# Patient Record
Sex: Male | Born: 1935 | Race: Black or African American | Hispanic: No | Marital: Married | Smoking: Former smoker
Health system: Southern US, Community
[De-identification: ages and names within clinical notes are randomized; demographics above are authoritative.]

---

## 1998-05-14 ENCOUNTER — Encounter: Payer: Self-pay | Admitting: Emergency Medicine

## 1998-05-14 ENCOUNTER — Emergency Department (HOSPITAL_COMMUNITY): Admission: EM | Admit: 1998-05-14 | Discharge: 1998-05-14 | Payer: Self-pay | Admitting: Emergency Medicine

## 1999-08-12 ENCOUNTER — Emergency Department (HOSPITAL_COMMUNITY): Admission: EM | Admit: 1999-08-12 | Discharge: 1999-08-12 | Payer: Self-pay | Admitting: Emergency Medicine

## 2001-08-02 ENCOUNTER — Emergency Department (HOSPITAL_COMMUNITY): Admission: EM | Admit: 2001-08-02 | Discharge: 2001-08-02 | Payer: Self-pay | Admitting: Emergency Medicine

## 2003-09-30 ENCOUNTER — Emergency Department (HOSPITAL_COMMUNITY): Admission: EM | Admit: 2003-09-30 | Discharge: 2003-09-30 | Payer: Self-pay

## 2004-11-11 ENCOUNTER — Ambulatory Visit (HOSPITAL_COMMUNITY): Admission: RE | Admit: 2004-11-11 | Discharge: 2004-11-11 | Payer: Self-pay | Admitting: Gastroenterology

## 2006-09-21 ENCOUNTER — Emergency Department (HOSPITAL_COMMUNITY): Admission: EM | Admit: 2006-09-21 | Discharge: 2006-09-21 | Payer: Self-pay | Admitting: Family Medicine

## 2007-12-04 ENCOUNTER — Emergency Department (HOSPITAL_COMMUNITY): Admission: EM | Admit: 2007-12-04 | Discharge: 2007-12-04 | Payer: Self-pay | Admitting: Emergency Medicine

## 2008-02-25 ENCOUNTER — Emergency Department (HOSPITAL_COMMUNITY): Admission: EM | Admit: 2008-02-25 | Discharge: 2008-02-25 | Payer: Self-pay | Admitting: Emergency Medicine

## 2010-11-29 NOTE — Op Note (Signed)
NAME:  [name], [name] NO.:  [id number]   MEDICAL RECORD NO.:  [id number]          PATIENT TYPE:  AMB   LOCATION:  ENDO                         FACILITY:  [facility]   PHYSICIAN:  Danise Edge, M.D.   DATE OF BIRTH:  21-Jul-1935   DATE OF PROCEDURE:  11/11/2004  DATE OF DISCHARGE:                                 OPERATIVE REPORT   INDICATIONS:  [name] is a 76 year old male born 15-Mar-1936.  [name] is scheduled to undergo his first screening colonoscopy with  polypectomy to prevent colon cancer. He denies a personal or family history  of colorectal neoplasia.   ENDOSCOPIST:  Danise Edge, M.D.   PREMEDICATION:  Versed 2.5 milligrams, Demerol 50 milligrams.   PROCEDURE:  After obtaining informed consent, [name] was placed in the  left lateral decubitus position. I administered intravenous Demerol and  intravenous Versed to achieve conscious sedation for the procedure. The  patient's blood pressure, oxygen saturation and cardiac rhythm were  monitored throughout the procedure and documented in the medical record.   Anal inspection was normal. Digital rectal exam reveals an enlarged but  nonnodular prostate. The Olympus adjustable pediatric colonoscope was  introduced into the rectum and advanced to the cecum.  A normal-appearing  ileocecal valve and appendiceal orifice were identified. Colonic preparation  for the exam today was excellent.   Rectum normal.  Retroflexed view of the distal rectum normal.  Sigmoid colon and descending colon normal.  Splenic flexure normal.  Transverse colon normal.  Hepatic flexure normal.  Ascending colon normal.  Cecum and ileocecal valve normal.   ASSESSMENT:  Normal screening proctocolonoscopy to the cecum.     MJ/MEDQ  D:  11/11/2004  T:  11/11/2004  Job:  [id number]   cc:   Georgann Housekeeper, MD  [address]  [place]  [place] [address]  Fax: [phone]

## 2011-04-11 LAB — POCT I-STAT, CHEM 8
BUN: 24 — ABNORMAL HIGH
Calcium, Ion: 1.12
Chloride: 103
Creatinine, Ser: 1.4
Glucose, Bld: 115 — ABNORMAL HIGH
HCT: 46
Hemoglobin: 15.6
Potassium: 4.7
Sodium: 138
TCO2: 29

## 2011-04-11 LAB — POCT CARDIAC MARKERS
CKMB, poc: 2
Myoglobin, poc: 85
Troponin i, poc: <0.05

## 2016-04-03 ENCOUNTER — Emergency Department (HOSPITAL_COMMUNITY): Payer: BLUE CROSS/BLUE SHIELD

## 2016-04-03 ENCOUNTER — Encounter (HOSPITAL_COMMUNITY): Payer: Self-pay

## 2016-04-03 ENCOUNTER — Emergency Department (HOSPITAL_COMMUNITY)
Admission: EM | Admit: 2016-04-03 | Discharge: 2016-04-03 | Discharge status: Against Medical Advice | Payer: BLUE CROSS/BLUE SHIELD | Attending: Emergency Medicine | Admitting: Emergency Medicine

## 2016-04-03 DIAGNOSIS — R61 Generalized hyperhidrosis: Secondary | ICD-10-CM | POA: Insufficient documentation

## 2016-04-03 DIAGNOSIS — R55 Syncope and collapse: Secondary | ICD-10-CM | POA: Insufficient documentation

## 2016-04-03 DIAGNOSIS — Z87891 Personal history of nicotine dependence: Secondary | ICD-10-CM | POA: Diagnosis not present

## 2016-04-03 DIAGNOSIS — R9431 Abnormal electrocardiogram [ECG] [EKG]: Secondary | ICD-10-CM | POA: Diagnosis not present

## 2016-04-03 DIAGNOSIS — R42 Dizziness and giddiness: Secondary | ICD-10-CM | POA: Diagnosis present

## 2016-04-03 LAB — CBC WITH DIFFERENTIAL/PLATELET
BASOS PCT: 1 %
Basophils Absolute: 0.1 10*3/uL (ref 0.0–0.1)
EOS ABS: 0.5 10*3/uL (ref 0.0–0.7)
Eosinophils Relative: 8 %
HCT: 40.4 % (ref 39.0–52.0)
HEMOGLOBIN: 13.2 g/dL (ref 13.0–17.0)
Lymphocytes Relative: 37 %
Lymphs Abs: 2.4 10*3/uL (ref 0.7–4.0)
MCH: 30.8 pg (ref 26.0–34.0)
MCHC: 32.7 g/dL (ref 30.0–36.0)
MCV: 94.4 fL (ref 78.0–100.0)
MONOS PCT: 9 %
Monocytes Absolute: 0.6 10*3/uL (ref 0.1–1.0)
NEUTROS PCT: 45 %
Neutro Abs: 2.9 10*3/uL (ref 1.7–7.7)
Platelets: 243 10*3/uL (ref 150–400)
RBC: 4.28 MIL/uL (ref 4.22–5.81)
RDW: 13.7 % (ref 11.5–15.5)
WBC: 6.4 10*3/uL (ref 4.0–10.5)

## 2016-04-03 LAB — BASIC METABOLIC PANEL
Anion gap: 4 — ABNORMAL LOW (ref 5–15)
BUN: 19 mg/dL (ref 6–20)
CALCIUM: 8.8 mg/dL — AB (ref 8.9–10.3)
CHLORIDE: 106 mmol/L (ref 101–111)
CO2: 29 mmol/L (ref 22–32)
CREATININE: 1.36 mg/dL — AB (ref 0.61–1.24)
GFR, EST AFRICAN AMERICAN: 56 mL/min — AB (ref >60)
GFR, EST NON AFRICAN AMERICAN: 49 mL/min — AB (ref >60)
Glucose, Bld: 100 mg/dL — ABNORMAL HIGH (ref 65–99)
Potassium: 4.2 mmol/L (ref 3.5–5.1)
SODIUM: 139 mmol/L (ref 135–145)

## 2016-04-03 LAB — CBG MONITORING, ED: Glucose-Capillary: 81 mg/dL (ref 65–99)

## 2016-04-03 LAB — TROPONIN I

## 2016-04-03 NOTE — ED Triage Notes (Signed)
Per EMS- Patient was at work and became diaphoretic and having a near syncopal episode. Patient's initial BP-190/70.  Patient denies any medical problems.

## 2016-04-03 NOTE — ED Provider Notes (Signed)
[facility]-EMERGENCY DEPT Provider Note   CSN: [id number] Arrival date & time: 04/03/16  4098     History   Chief Complaint Chief Complaint  Patient presents with  . Near Syncope    sweating    HPI Charles Porter is a 80 y.o. male.  HPI  Pt was seen at 0745. Per pt, c/o sudden onset and resolution of one episode of near syncope that occurred PTA. Pt states he was standing and suddenly began to "sweat" and "feel lightheaded." Pt states he sat down and his symptoms resolved over 5 to 10 minutes. Denies any other symptoms. Symptoms remain resolved upon arrival to the ED. Denies CP/palpitations, no SOB/cough, no abd pain, no N/V/D, no focal motor weakness, no tingling/numbness in extremities, no vertigo.   History reviewed. No pertinent past medical history.  There are no active problems to display for this patient.   History reviewed. No pertinent surgical history.    Home Medications    Prior to Admission medications   Not on File    Family History History reviewed. No pertinent family history.  Social History Social History  Substance Use Topics  . Smoking status: Former Smoker    Types: Cigarettes  . Smokeless tobacco: Never Used  . Alcohol use No     Allergies   Review of patient's allergies indicates not on file.   Review of Systems Review of Systems ROS: Statement: All systems negative except as marked or noted in the HPI; Constitutional: Negative for fever and chills. +diaphoresis, near syncope.; ; Eyes: Negative for eye pain, redness and discharge. ; ; ENMT: Negative for ear pain, hoarseness, nasal congestion, sinus pressure and sore throat. ; ; Cardiovascular: Negative for chest pain, palpitations, dyspnea and peripheral edema. ; ; Respiratory: Negative for cough, wheezing and stridor. ; ; Gastrointestinal: Negative for nausea, vomiting, diarrhea, abdominal pain, blood in stool, hematemesis, jaundice and rectal bleeding. . ; ; Genitourinary: Negative for  dysuria, flank pain and hematuria. ; ; Musculoskeletal: Negative for back pain and neck pain. Negative for swelling and trauma.; ; Skin: Negative for pruritus, rash, abrasions, blisters, bruising and skin lesion.; ; Neuro: Negative for headache and neck stiffness. Negative for altered level of consciousness, altered mental status, extremity weakness, paresthesias, involuntary movement, seizure and syncope.       Physical Exam Updated Vital Signs BP 181/62 (BP Location: Left Arm)   Pulse 60   Temp 97.6 F (36.4 C) (Oral)   Resp 16   SpO2 98%     08:24 Orthostatic Vital Signs AR  Orthostatic Lying   BP- Lying: 170/56  Pulse- Lying: 54      Orthostatic Sitting  BP- Sitting: 171/68  Pulse- Sitting: 58      Orthostatic Standing at 0 minutes  BP- Standing at 0 minutes: 188/66  Pulse- Standing at 0 minutes: 55     Physical Exam 0750: Physical examination:  Nursing notes reviewed; Vital signs and O2 SAT reviewed;  Constitutional: Well developed, Well nourished, Well hydrated, In no acute distress; Head:  Normocephalic, atraumatic; Eyes: EOMI, PERRL, No scleral icterus; ENMT: Mouth and pharynx normal, Mucous membranes moist; Neck: Supple, Full range of motion, No lymphadenopathy; Cardiovascular: Regular rate and rhythm, No gallop; Respiratory: Breath sounds clear & equal bilaterally, No wheezes.  Speaking full sentences with ease, Normal respiratory effort/excursion; Chest: Nontender, Movement normal; Abdomen: Soft, Nontender, Nondistended, Normal bowel sounds; Genitourinary: No CVA tenderness; Extremities: Pulses normal, No tenderness, No edema, No calf edema or asymmetry.; Neuro: AA&Ox3, Major  CN grossly intact. No facial droop. Speech clear. Grips equal. Strength 5/5 equal bilat UE's and LE's. No gross focal motor or sensory deficits in extremities.; Skin: Color normal, Warm, Dry.   ED Treatments / Results  Labs (all labs ordered are listed, but only abnormal results are  displayed)   EKG  EKG Interpretation  Date/Time:  Thursday April 03 2016 07:38:35 EDT Ventricular Rate:  51 PR Interval:    QRS Duration: 78 QT Interval:  434 QTC Calculation: 400 R Axis:   -5 Text Interpretation:  Sinus rhythm Atrial premature complex Anterior infarct, old Nonspecific T abnormalities, lateral leads Minimal ST elevation, inferior leads When compared with ECG of 02/25/2008 ST & T wave abnormality is now Present Confirmed by [facility]  MD, Nicholos JohnsKATHLEEN [phone]([id number]) on 04/03/2016 8:12:32 AM        EKG Interpretation  Date/Time:  Thursday April 03 2016 07:45:52 EDT Ventricular Rate:  60 PR Interval:    QRS Duration: 77 QT Interval:  421 QTC Calculation: 421 R Axis:   5 Text Interpretation:  Sinus rhythm Anterior infarct, old Nonspecific T abnormalities, lateral leads Minimal ST elevation, inferior leads No significant change since last tracing earler today Confirmed by [facility]  MD, Nicholos JohnsKATHLEEN [phone]([id number]) on 04/03/2016 8:13:32 AM         Radiology   Procedures Procedures (including critical care time)  Medications Ordered in ED Medications - No data to display   Initial Impression / Assessment and Plan / ED Course  I have reviewed the triage vital signs and the nursing notes.  Pertinent labs & imaging results that were available during my care of the patient were reviewed by me and considered in my medical decision making (see chart for details).  MDM Reviewed: previous chart, nursing note and vitals Reviewed previous: labs and ECG Interpretation: labs, ECG and x-ray   Results for orders placed or performed during the hospital encounter of 04/03/16  CBC with Differential  Result Value Ref Range   WBC 6.4 4.0 - 10.5 K/uL   RBC 4.28 4.22 - 5.81 MIL/uL   Hemoglobin 13.2 13.0 - 17.0 g/dL   HCT 41.340.4 24.439.0 - 01.052.0 %   MCV 94.4 78.0 - 100.0 fL   MCH 30.8 26.0 - 34.0 pg   MCHC 32.7 30.0 - 36.0 g/dL   RDW 27.213.7 53.611.5 - 64.415.5 %   Platelets 243 150 - 400 K/uL    Neutrophils Relative % 45 %   Neutro Abs 2.9 1.7 - 7.7 K/uL   Lymphocytes Relative 37 %   Lymphs Abs 2.4 0.7 - 4.0 K/uL   Monocytes Relative 9 %   Monocytes Absolute 0.6 0.1 - 1.0 K/uL   Eosinophils Relative 8 %   Eosinophils Absolute 0.5 0.0 - 0.7 K/uL   Basophils Relative 1 %   Basophils Absolute 0.1 0.0 - 0.1 K/uL  Basic metabolic panel  Result Value Ref Range   Sodium 139 135 - 145 mmol/L   Potassium 4.2 3.5 - 5.1 mmol/L   Chloride 106 101 - 111 mmol/L   CO2 29 22 - 32 mmol/L   Glucose, Bld 100 (H) 65 - 99 mg/dL   BUN 19 6 - 20 mg/dL   Creatinine, Ser 0.341.36 (H) 0.61 - 1.24 mg/dL   Calcium 8.8 (L) 8.9 - 10.3 mg/dL   GFR calc non Af Amer 49 (L) >60 mL/min   GFR calc Af Amer 56 (L) >60 mL/min   Anion gap 4 (L) 5 - 15  Troponin I  Result  Value Ref Range   Troponin I <0.03 <0.03 ng/mL  CBG monitoring, ED  Result Value Ref Range   Glucose-Capillary 81 65 - 99 mg/dL   Dg Chest 2 View Result Date: 04/03/2016 CLINICAL DATA:  Patient with dizziness.  Blurry vision. EXAM: CHEST  2 VIEW COMPARISON:  Chest radiograph 09/30/2003. FINDINGS: Multiple monitoring leads overlie the patient. Normal cardiac and mediastinal contours. No consolidative pulmonary opacities. No pleural effusion or pneumothorax. Regional skeleton is unremarkable. IMPRESSION: No active cardiopulmonary disease. Electronically Signed   By: Annia Belt M.D.   On: 04/03/2016 08:14    0750:  EKG plus repeat obtained.  T/C to [facility] STEMI Dr. Clifton James, case discussed, including:  HPI, pertinent PM/SHx, VS/PE, dx testing, ED course and treatment:  He has viewed the EKG's, agrees they are abnl but not acute STEMI; pt needs admit with Cards consult.  1610: T/C to Unassigned Cards Dr. Jacinto Halim, case discussed, including:  HPI, pertinent PM/SHx, VS/PE, dx testing, ED course and treatment:  Agreeable to consult, admit to medicine service.  9604:  Pt now refuses to stay for observation admission. Pt informed re: dx testing results,  including new EKG changes concerning for cardiac insufficiency and that I recommend observation admission for further evaluation.  Pt refuses admission.  I encouraged pt to stay, continues to refuse.  Pt makes his own medical decisions.  Risks of AMA explained to pt, including, but not limited to:  stroke, heart attack, cardiac arrythmia ("irregular heart rate/beat"), "passing out," temporary and/or permanent disability, death.  Pt verb understanding and continues to refuse admission, understanding the consequences of his decision.  I encouraged pt to follow up with his PMD tomorrow and return to the ED immediately if symptoms return, or for any other concerns.  Pt verb understanding, agreeable.     Final Clinical Impressions(s) / ED Diagnoses   Final diagnoses:  None    New Prescriptions New Prescriptions   No medications on file     Samuel Jester, DO 04/05/16 1241

## 2016-04-03 NOTE — ED Notes (Signed)
Bed: WA04 Expected date:  Expected time:  Means of arrival:  Comments: EMS- Diaphoresis/HTN/Light headed

## 2016-04-03 NOTE — Discharge Instructions (Signed)
Your EKG today was abnormal.  Call your regular medical doctor and the Cardiologist today to schedule a follow up appointment within the next 2 days.  Return to the Emergency Department immediately sooner if worsening or you change your mind about being admitted.

## 2017-09-22 ENCOUNTER — Encounter (HOSPITAL_COMMUNITY): Payer: Self-pay | Admitting: Emergency Medicine

## 2017-09-22 ENCOUNTER — Emergency Department (HOSPITAL_COMMUNITY)
Admission: EM | Admit: 2017-09-22 | Discharge: 2017-09-23 | Disposition: A | Discharge status: Home / Self Care | Payer: BLUE CROSS/BLUE SHIELD | Attending: Emergency Medicine | Admitting: Emergency Medicine

## 2017-09-22 ENCOUNTER — Other Ambulatory Visit: Payer: Self-pay

## 2017-09-22 DIAGNOSIS — J111 Influenza due to unidentified influenza virus with other respiratory manifestations: Secondary | ICD-10-CM | POA: Insufficient documentation

## 2017-09-22 DIAGNOSIS — Z87891 Personal history of nicotine dependence: Secondary | ICD-10-CM | POA: Diagnosis not present

## 2017-09-22 DIAGNOSIS — R69 Illness, unspecified: Secondary | ICD-10-CM

## 2017-09-22 DIAGNOSIS — R05 Cough: Secondary | ICD-10-CM | POA: Diagnosis present

## 2017-09-22 NOTE — ED Notes (Signed)
Called patient 3x's in waiting room to call his daughter and he didn't answer.

## 2017-09-22 NOTE — ED Notes (Signed)
Please ask patient to call his daughter, Jedediah MoccasinChantelle @ [phone]

## 2017-09-22 NOTE — ED Triage Notes (Signed)
Pt reports generalized weakness including generalized body aches and joint soreness for the past week.  Denies n/v/d, fever "broke" yesterday.  Reports increased "flem" but did not cough.

## 2017-09-23 ENCOUNTER — Emergency Department (HOSPITAL_COMMUNITY): Payer: BLUE CROSS/BLUE SHIELD

## 2017-09-23 DIAGNOSIS — J111 Influenza due to unidentified influenza virus with other respiratory manifestations: Secondary | ICD-10-CM | POA: Diagnosis not present

## 2017-09-23 NOTE — ED Notes (Signed)
Pt did not answer in waiting room to go back to B16. Pt moved  Back to waiting room.

## 2017-09-23 NOTE — ED Notes (Signed)
NA x3

## 2017-09-23 NOTE — ED Notes (Signed)
Louraine called this RN and said pt showed up to front and sts he has been in waiting room the whole time.

## 2017-09-23 NOTE — ED Provider Notes (Signed)
[facility] EMERGENCY DEPARTMENT Provider Note   CSN: [id number] Arrival date & time: 09/22/17  2044     History   Chief Complaint Chief Complaint  Patient presents with  . flu like symptoms    HPI Charles Porter is a 82 y.o. male.  The history is provided by the patient.  He comes in with a 4-day history of generalized malaise.  There has been nasal congestion and a cough productive of yellow sputum.  He denies dyspnea and denies fever or chills.  He has had occasional sweats.  He denies nausea or vomiting.  He denies arthralgias or myalgias.  He has treated himself with a variety of over-the-counter cough and cold medications without any benefit.  He is concerned he might have influenza.  He did not receive the influenza vaccination.  He denies any sick contacts.  He is a non-smoker.  History reviewed. No pertinent past medical history.  There are no active problems to display for this patient.   History reviewed. No pertinent surgical history.     Home Medications    Prior to Admission medications   Not on File    Family History No family history on file.  Social History Social History   Tobacco Use  . Smoking status: Former Smoker    Types: Cigarettes  . Smokeless tobacco: Never Used  Substance Use Topics  . Alcohol use: No  . Drug use: No     Allergies   Patient has no known allergies.   Review of Systems Review of Systems  All other systems reviewed and are negative.    Physical Exam Updated Vital Signs BP (!) 157/58 (BP Location: Right Arm)   Pulse (!) 56   Temp 98.7 F (37.1 C) (Oral)   Resp 16   Ht 5\' 6"  (1.676 m)   Wt 65.8 kg (145 lb)   SpO2 99%   BMI 23.40 kg/m   Physical Exam  Nursing note and vitals reviewed.  82 year old male, resting comfortably and in no acute distress. Vital signs are significant for elevated systolic blood pressure. Oxygen saturation is 99%, which is normal. Head is normocephalic and  atraumatic. PERRLA, EOMI. Oropharynx is clear.  There is no sinus tenderness. Neck is nontender and supple without adenopathy or JVD. Back is nontender and there is no CVA tenderness. Lungs have faint rales at the left base without wheezes or rhonchi. Chest is nontender. Heart has regular rate and rhythm without murmur. Abdomen is soft, flat, nontender without masses or hepatosplenomegaly and peristalsis is normoactive. Extremities have no cyanosis or edema, full range of motion is present. Skin is warm and dry without rash. Neurologic: Mental status is normal, cranial nerves are intact, there are no motor or sensory deficits.  ED Treatments / Results   Radiology Dg Chest 2 View  Result Date: 09/23/2017 CLINICAL DATA:  Cough and weakness EXAM: CHEST - 2 VIEW COMPARISON:  Chest radiograph 04/03/2016 FINDINGS: The heart size and mediastinal contours are within normal limits. Both lungs are clear. The visualized skeletal structures are unremarkable. IMPRESSION: No active cardiopulmonary disease. Electronically Signed   By: Deatra Robinson M.D.   On: 09/23/2017 05:16    Procedures Procedures (including critical care time)  Medications Ordered in ED Medications - No data to display   Initial Impression / Assessment and Plan / ED Course  I have reviewed the triage vital signs and the nursing notes.  Pertinent imaging results that were available during my  care of the patient were reviewed by me and considered in my medical decision making (see chart for details).  Influenza-like illness.  He is outside the window for antiviral treatment, so influenza testing is not indicated.  Will check chest x-ray to rule out pneumonia.  Old records are reviewed, and he has no relevant past visits.  Chest x-ray shows no evidence of pneumonia.  He is advised to continue using over-the-counter cough and cold medications, encouraged to maintain adequate hydration.  Return precautions discussed.  Final  Clinical Impressions(s) / ED Diagnoses   Final diagnoses:  Influenza-like illness    ED Discharge Orders    None       Dione BoozeGlick, Constancia Geeting, MD 09/23/17 [phone]

## 2017-09-23 NOTE — Discharge Instructions (Signed)
Continue using over-the-counter cold and flu medication. Make sure to drink plenty of fluids.  Get the flu shot when it comes out in the fall, and get it every year.

## 2017-09-23 NOTE — ED Notes (Signed)
Pt departed in NAD, refused use of wheelchair.  

## 2020-10-13 ENCOUNTER — Inpatient Hospital Stay (HOSPITAL_BASED_OUTPATIENT_CLINIC_OR_DEPARTMENT_OTHER)
Admission: EM | Admit: 2020-10-13 | Discharge: 2020-10-17 | DRG: 065 | Disposition: A | Discharge status: Rehabilitation Facility | Payer: BC Managed Care – PPO | Attending: Internal Medicine | Admitting: Internal Medicine

## 2020-10-13 ENCOUNTER — Emergency Department (HOSPITAL_BASED_OUTPATIENT_CLINIC_OR_DEPARTMENT_OTHER): Payer: BC Managed Care – PPO

## 2020-10-13 ENCOUNTER — Encounter (HOSPITAL_BASED_OUTPATIENT_CLINIC_OR_DEPARTMENT_OTHER): Payer: Self-pay

## 2020-10-13 ENCOUNTER — Other Ambulatory Visit: Payer: Self-pay

## 2020-10-13 ENCOUNTER — Ambulatory Visit
Admission: EM | Admit: 2020-10-13 | Discharge: 2020-10-13 | Disposition: A | Discharge status: Other Acute Inpatient Hospital | Payer: Medicare Other

## 2020-10-13 ENCOUNTER — Observation Stay (HOSPITAL_COMMUNITY): Payer: BC Managed Care – PPO

## 2020-10-13 DIAGNOSIS — K5901 Slow transit constipation: Secondary | ICD-10-CM | POA: Diagnosis present

## 2020-10-13 DIAGNOSIS — I248 Other forms of acute ischemic heart disease: Secondary | ICD-10-CM | POA: Diagnosis not present

## 2020-10-13 DIAGNOSIS — R471 Dysarthria and anarthria: Secondary | ICD-10-CM | POA: Diagnosis not present

## 2020-10-13 DIAGNOSIS — I5032 Chronic diastolic (congestive) heart failure: Secondary | ICD-10-CM | POA: Diagnosis not present

## 2020-10-13 DIAGNOSIS — E785 Hyperlipidemia, unspecified: Secondary | ICD-10-CM | POA: Diagnosis not present

## 2020-10-13 DIAGNOSIS — Z8673 Personal history of transient ischemic attack (TIA), and cerebral infarction without residual deficits: Secondary | ICD-10-CM

## 2020-10-13 DIAGNOSIS — I6340 Cerebral infarction due to embolism of unspecified cerebral artery: Secondary | ICD-10-CM | POA: Diagnosis not present

## 2020-10-13 DIAGNOSIS — R29810 Facial weakness: Secondary | ICD-10-CM | POA: Diagnosis not present

## 2020-10-13 DIAGNOSIS — Z87891 Personal history of nicotine dependence: Secondary | ICD-10-CM

## 2020-10-13 DIAGNOSIS — H5509 Other forms of nystagmus: Secondary | ICD-10-CM | POA: Diagnosis not present

## 2020-10-13 DIAGNOSIS — I110 Hypertensive heart disease with heart failure: Secondary | ICD-10-CM | POA: Diagnosis present

## 2020-10-13 DIAGNOSIS — E860 Dehydration: Secondary | ICD-10-CM | POA: Diagnosis present

## 2020-10-13 DIAGNOSIS — I160 Hypertensive urgency: Secondary | ICD-10-CM | POA: Diagnosis present

## 2020-10-13 DIAGNOSIS — I10 Essential (primary) hypertension: Secondary | ICD-10-CM

## 2020-10-13 DIAGNOSIS — G459 Transient cerebral ischemic attack, unspecified: Secondary | ICD-10-CM

## 2020-10-13 DIAGNOSIS — R42 Dizziness and giddiness: Secondary | ICD-10-CM | POA: Diagnosis present

## 2020-10-13 DIAGNOSIS — Z20822 Contact with and (suspected) exposure to covid-19: Secondary | ICD-10-CM | POA: Diagnosis present

## 2020-10-13 DIAGNOSIS — E041 Nontoxic single thyroid nodule: Secondary | ICD-10-CM | POA: Diagnosis not present

## 2020-10-13 DIAGNOSIS — I6501 Occlusion and stenosis of right vertebral artery: Secondary | ICD-10-CM | POA: Diagnosis present

## 2020-10-13 DIAGNOSIS — R112 Nausea with vomiting, unspecified: Secondary | ICD-10-CM | POA: Diagnosis present

## 2020-10-13 DIAGNOSIS — I639 Cerebral infarction, unspecified: Secondary | ICD-10-CM

## 2020-10-13 DIAGNOSIS — I161 Hypertensive emergency: Secondary | ICD-10-CM | POA: Diagnosis not present

## 2020-10-13 LAB — COMPREHENSIVE METABOLIC PANEL
ALT: 19 U/L (ref 0–44)
AST: 44 U/L — ABNORMAL HIGH (ref 15–41)
Albumin: 3.9 g/dL (ref 3.5–5.0)
Alkaline Phosphatase: 72 U/L (ref 38–126)
Anion gap: 12 (ref 5–15)
BUN: 26 mg/dL — ABNORMAL HIGH (ref 8–23)
CO2: 25 mmol/L (ref 22–32)
Calcium: 9.3 mg/dL (ref 8.9–10.3)
Chloride: 101 mmol/L (ref 98–111)
Creatinine, Ser: 1.15 mg/dL (ref 0.61–1.24)
GFR, Estimated: >60 mL/min (ref >60)
Glucose, Bld: 85 mg/dL (ref 70–99)
Potassium: 4.3 mmol/L (ref 3.5–5.1)
Sodium: 138 mmol/L (ref 135–145)
Total Bilirubin: 1.1 mg/dL (ref 0.3–1.2)
Total Protein: 7.7 g/dL (ref 6.5–8.1)

## 2020-10-13 LAB — CBC WITH DIFFERENTIAL/PLATELET
Abs Immature Granulocytes: 0.01 10*3/uL (ref 0.00–0.07)
Basophils Absolute: 0.1 10*3/uL (ref 0.0–0.1)
Basophils Relative: 1 %
Eosinophils Absolute: 0.1 10*3/uL (ref 0.0–0.5)
Eosinophils Relative: 1 %
HCT: 45.7 % (ref 39.0–52.0)
Hemoglobin: 14.9 g/dL (ref 13.0–17.0)
Immature Granulocytes: 0 %
Lymphocytes Relative: 33 %
Lymphs Abs: 2.5 10*3/uL (ref 0.7–4.0)
MCH: 30.4 pg (ref 26.0–34.0)
MCHC: 32.6 g/dL (ref 30.0–36.0)
MCV: 93.3 fL (ref 80.0–100.0)
Monocytes Absolute: 0.6 10*3/uL (ref 0.1–1.0)
Monocytes Relative: 7 %
Neutro Abs: 4.3 10*3/uL (ref 1.7–7.7)
Neutrophils Relative %: 58 %
Platelets: 269 10*3/uL (ref 150–400)
RBC: 4.9 MIL/uL (ref 4.22–5.81)
RDW: 13 % (ref 11.5–15.5)
WBC: 7.4 10*3/uL (ref 4.0–10.5)
nRBC: 0 % (ref 0.0–0.2)

## 2020-10-13 LAB — PROTIME-INR
INR: 1.1 (ref 0.8–1.2)
Prothrombin Time: 13.8 seconds (ref 11.4–15.2)

## 2020-10-13 LAB — RESP PANEL BY RT-PCR (FLU A&B, COVID) ARPGX2
Influenza A by PCR: NEGATIVE
Influenza B by PCR: NEGATIVE
SARS Coronavirus 2 by RT PCR: NEGATIVE

## 2020-10-13 LAB — URINALYSIS, ROUTINE W REFLEX MICROSCOPIC
Bilirubin Urine: NEGATIVE
Glucose, UA: NEGATIVE mg/dL
Hgb urine dipstick: NEGATIVE
Ketones, ur: 40 mg/dL — AB
Leukocytes,Ua: NEGATIVE
Nitrite: NEGATIVE
Protein, ur: 30 mg/dL — AB
Specific Gravity, Urine: 1.027 (ref 1.005–1.030)
pH: 5.5 (ref 5.0–8.0)

## 2020-10-13 LAB — TROPONIN I (HIGH SENSITIVITY)
Troponin I (High Sensitivity): 67 ng/L — ABNORMAL HIGH (ref <18)
Troponin I (High Sensitivity): 73 ng/L — ABNORMAL HIGH (ref <18)

## 2020-10-13 LAB — CBG MONITORING, ED: Glucose-Capillary: 103 mg/dL — ABNORMAL HIGH (ref 70–99)

## 2020-10-13 MED ORDER — ACETAMINOPHEN 650 MG RE SUPP: 650.0000 mg | RECTAL | Status: DC | PRN

## 2020-10-13 MED ORDER — HYDRALAZINE HCL 20 MG/ML IJ SOLN: 10.0000 mg | INTRAMUSCULAR | Status: DC | PRN

## 2020-10-13 MED ORDER — ASPIRIN 325 MG PO TABS
325.0000 mg | ORAL_TABLET | Freq: Every day | ORAL | Status: DC
  Administered 2020-10-14 – 2020-10-17 (×4): 325 mg via ORAL
  Filled 2020-10-13 (×4): qty 1

## 2020-10-13 MED ORDER — IOHEXOL 350 MG/ML SOLN
100.0000 mL | Freq: Once | INTRAVENOUS | Status: AC | PRN
  Administered 2020-10-13: 100 mL via INTRAVENOUS

## 2020-10-13 MED ORDER — AMLODIPINE BESYLATE 10 MG PO TABS
10.0000 mg | ORAL_TABLET | Freq: Once | ORAL | Status: DC
  Filled 2020-10-13 (×2): qty 1

## 2020-10-13 MED ORDER — ENOXAPARIN SODIUM 40 MG/0.4ML ~~LOC~~ SOLN
40.0000 mg | Freq: Every day | SUBCUTANEOUS | Status: DC
  Administered 2020-10-14 – 2020-10-16 (×4): 40 mg via SUBCUTANEOUS
  Filled 2020-10-13 (×4): qty 0.4

## 2020-10-13 MED ORDER — ASPIRIN 300 MG RE SUPP
300.0000 mg | Freq: Every day | RECTAL | Status: DC
  Filled 2020-10-13: qty 1

## 2020-10-13 MED ORDER — ONDANSETRON HCL 4 MG/2ML IJ SOLN
INTRAMUSCULAR | Status: AC
  Administered 2020-10-13: 4 mg
  Filled 2020-10-13: qty 2

## 2020-10-13 MED ORDER — ACETAMINOPHEN 160 MG/5ML PO SOLN: 650.0000 mg | ORAL | Status: DC | PRN

## 2020-10-13 MED ORDER — ACETAMINOPHEN 325 MG PO TABS
650.0000 mg | ORAL_TABLET | ORAL | Status: DC | PRN
  Administered 2020-10-14: 650 mg via ORAL
  Filled 2020-10-13: qty 2

## 2020-10-13 MED ORDER — STROKE: EARLY STAGES OF RECOVERY BOOK
Freq: Once | Status: DC
  Filled 2020-10-13 (×2): qty 1

## 2020-10-13 MED ORDER — HYDRALAZINE HCL 20 MG/ML IJ SOLN: 2.0000 mg | INTRAMUSCULAR | Status: DC | PRN

## 2020-10-13 MED ORDER — HYDRALAZINE HCL 20 MG/ML IJ SOLN: 20.0000 mg | Freq: Once | INTRAMUSCULAR | Status: DC

## 2020-10-13 MED ORDER — HYDRALAZINE HCL 20 MG/ML IJ SOLN
2.0000 mg | INTRAMUSCULAR | Status: DC | PRN
  Administered 2020-10-13: 2 mg via INTRAVENOUS
  Filled 2020-10-13: qty 1

## 2020-10-13 MED ORDER — HYDRALAZINE HCL 20 MG/ML IJ SOLN
10.0000 mg | Freq: Once | INTRAMUSCULAR | Status: AC
  Administered 2020-10-13: 10 mg via INTRAVENOUS
  Filled 2020-10-13: qty 1

## 2020-10-13 NOTE — H&P (Signed)
History and Physical    Charles Porter VQM:[id number] DOB: 24-Aug-1935 DOA: 10/13/2020  PCP: [name], MD  Patient coming from: Home  I have personally briefly reviewed patient's old medical records in [facility]  Chief Complaint: dizziness  HPI: Charles Porter is a 85 y.o. male with no significant PMH, takes no chronic medications.  Doesn't regularly follow with a doctor however.  Pt presents to ED at [facility] with c/o 4 day h/o intermittent dizziness described as feeling off balance and room spinning sensation.  Ambulating with use of cane since that time.  Due to persistent symptoms pt presented to ED.  No CP, cough, fevers, chills, SOB.   ED Course: In the ED pt BP noted to be 249 systolic.  Pt was given 10mg  hydralazine which dropped his BP all the way to 110 systolic and patient then developed acute onset of dysarthria and L facial droop.  Tele neuro was called.  CTA head and neck performed.  Pt transferred to [facility] for admission.  Since that time his BP has rebounded to 227 and his facial droop and dysarthria have resolved.   Review of Systems: As per HPI, otherwise all review of systems negative.  History reviewed. No pertinent past medical history.  History reviewed. No pertinent surgical history.   reports that he has quit smoking. His smoking use included cigarettes. He has never used smokeless tobacco. He reports that he does not drink alcohol and does not use drugs.  No Known Allergies  Family History  Problem Relation Age of Onset  . Hypertension Neg Hx      Prior to Admission medications   Not on File    Physical Exam: Vitals:   10/13/20 2045 10/13/20 2100 10/13/20 2147 10/13/20 2249  BP: (!) 176/78 (!) 177/137 (!) 202/62 (!) 227/65  Pulse: 75 81 (!) 55 (!) 57  Resp: (!) 21 19 16 11   Temp:   98.8 F (37.1 C)   TempSrc:   Oral   SpO2: 100% 100% 100% 100%  Weight:   64.4 kg   Height:   5\' 6"  (1.676 m)     Constitutional: NAD, calm,  comfortable Eyes: PERRL, lids and conjunctivae normal ENMT: Mucous membranes are moist. Posterior pharynx clear of any exudate or lesions.Normal dentition.  Neck: normal, supple, no masses, no thyromegaly Respiratory: clear to auscultation bilaterally, no wheezing, no crackles. Normal respiratory effort. No accessory muscle use.  Cardiovascular: Regular rate and rhythm, no murmurs / rubs / gallops. No extremity edema. 2+ pedal pulses. No carotid bruits.  Abdomen: no tenderness, no masses palpated. No hepatosplenomegaly. Bowel sounds positive.  Musculoskeletal: no clubbing / cyanosis. No joint deformity upper and lower extremities. Good ROM, no contractures. Normal muscle tone.  Skin: no rashes, lesions, ulcers. No induration Neurologic: CN 2-12 grossly intact. Sensation intact, DTR normal. Strength 5/5 in all 4.  Psychiatric: Normal judgment and insight. Alert and oriented x 3. Normal mood.    Labs on Admission: I have personally reviewed following labs and imaging studies  CBC: Recent Labs  Lab 10/13/20 1637  WBC 7.4  NEUTROABS 4.3  HGB 14.9  HCT 45.7  MCV 93.3  PLT 269   Basic Metabolic Panel: Recent Labs  Lab 10/13/20 1637  NA 138  K 4.3  CL 101  CO2 25  GLUCOSE 85  BUN 26*  CREATININE 1.15  CALCIUM 9.3   GFR: Estimated Creatinine Clearance: 44.7 mL/min (by C-G formula based on SCr of 1.15 mg/dL). Liver  Function Tests: Recent Labs  Lab 10/13/20 1637  AST 44*  ALT 19  ALKPHOS 72  BILITOT 1.1  PROT 7.7  ALBUMIN 3.9   No results for input(s): LIPASE, AMYLASE in the last 168 hours. No results for input(s): AMMONIA in the last 168 hours. Coagulation Profile: Recent Labs  Lab 10/13/20 1637  INR 1.1   Cardiac Enzymes: No results for input(s): CKTOTAL, CKMB, CKMBINDEX, TROPONINI in the last 168 hours. BNP (last 3 results) No results for input(s): PROBNP in the last 8760 hours. HbA1C: No results for input(s): HGBA1C in the last 72 hours. CBG: Recent Labs   Lab 10/13/20 1919  GLUCAP 103*   Lipid Profile: No results for input(s): CHOL, HDL, LDLCALC, TRIG, CHOLHDL, LDLDIRECT in the last 72 hours. Thyroid Function Tests: No results for input(s): TSH, T4TOTAL, FREET4, T3FREE, THYROIDAB in the last 72 hours. Anemia Panel: No results for input(s): VITAMINB12, FOLATE, FERRITIN, TIBC, IRON, RETICCTPCT in the last 72 hours. Urine analysis:    Component Value Date/Time   COLORURINE YELLOW 10/13/2020 1637   APPEARANCEUR CLEAR 10/13/2020 1637   LABSPEC 1.027 10/13/2020 1637   PHURINE 5.5 10/13/2020 1637   GLUCOSEU NEGATIVE 10/13/2020 1637   HGBUR NEGATIVE 10/13/2020 1637   BILIRUBINUR NEGATIVE 10/13/2020 1637   KETONESUR 40 (A) 10/13/2020 1637   PROTEINUR 30 (A) 10/13/2020 1637   NITRITE NEGATIVE 10/13/2020 1637   LEUKOCYTESUR NEGATIVE 10/13/2020 1637    Radiological Exams on Admission: CT Angio Head W or Wo Contrast  Result Date: 10/13/2020 CLINICAL DATA:  Initial evaluation for neuro deficit, stroke suspected. EXAM: CT ANGIOGRAPHY HEAD AND NECK CT PERFUSION BRAIN TECHNIQUE: Multidetector CT imaging of the head and neck was performed using the standard protocol during bolus administration of intravenous contrast. Multiplanar CT image reconstructions and MIPs were obtained to evaluate the vascular anatomy. Carotid stenosis measurements (when applicable) are obtained utilizing NASCET criteria, using the distal internal carotid diameter as the denominator. Multiphase CT imaging of the brain was performed following IV bolus contrast injection. Subsequent parametric perfusion maps were calculated using RAPID software. CONTRAST:  OMNIPAQUE IOHEXOL 350 MG/ML SOLN COMPARISON:  Prior CT from earlier the same day. FINDINGS: CTA NECK FINDINGS Aortic arch: Visualized aortic arch normal in caliber with normal 3 vessel morphology. Mild atheromatous change about the arch itself. No hemodynamically significant stenosis about the origin of the great vessels.  Visualized subclavian arteries patent. Right carotid system: Right CCA patent from its origin to the bifurcation without stenosis. Scattered calcified and noncalcified plaque about the right carotid bulb/proximal right ICA with associated stenosis of up to 60-70% by NASCET criteria. Right ICA patent distally to the skull base without stenosis, dissection or occlusion. Left carotid system: Left CCA patent from its origin to the bifurcation without stenosis. Scattered mixed plaque about the left carotid bulb/proximal left ICA with associated stenosis of up to 60-70% by NASCET criteria. Left ICA patent distally without stenosis, dissection or occlusion. Vertebral arteries: Both vertebral arteries arise from the subclavian arteries. 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 Scant distal reconstitution at the right V2 segment, with subsequent reocclusion by the skull base. Multifocal severe stenoses noted involving the proximal left V1 segment. Left vertebral artery otherwise patent to the skull base without stenosis or other acute vascular abnormality. Skeleton: No visible acute osseous finding. No discrete or worrisome osseous lesions. Other neck: No other acute soft tissue abnormality within the neck. 1.2 cm left thyroid nodule noted, felt to be of doubtful significance given size and patient  age. No follow-up imaging recommended (ref: J Am Coll Radiol. 2015 Feb;12(2): 143-50). Upper chest: 4 mm nodule partially visualized at the superior segment of the left lower lobe (series 5, image 1). Visualized upper chest demonstrates no other acute finding. Review of the MIP images confirms the above findings CTA HEAD FINDINGS Anterior circulation: Petrous segments patent bilaterally. Scattered atheromatous plaque throughout the carotid siphons with associated moderate to severe multifocal stenoses. A1 segments patent bilaterally. Normal anterior communicating artery complex. Anterior cerebral arteries patent to their  distal aspects without stenosis. M1 segments patent bilaterally. Normal MCA bifurcations. Focal moderate to severe proximal left M2 stenosis, inferior division (series 7, image 107). Left MCA branches well perfused distally. No proximal right MCA branch occlusion or stenosis. Diffuse small vessel atheromatous irregularity. Posterior circulation: Left vertebral artery patent as it courses into the cranial vault. Left PICA origin patent and normal. Focal moderate to severe distal left V4 stenosis (series 7, image 129). Right vertebral artery occluded at the skull base. Right PICA not seen. Basilar diminutive and mildly irregular but patent to its distal aspect without high-grade stenosis. Superior cerebral arteries patent bilaterally. Left PCA primarily supplied via the basilar. Right PCA supplied via the basilar as well as a robust right posterior communicating artery. Both PCAs widely patent to their distal aspects. Venous sinuses: Patent allowing for timing the contrast bolus. Anatomic variants: None significant.  No aneurysm. Review of the MIP images confirms the above findings CT Brain Perfusion Findings: ASPECTS: Not performed. CBF (<30%) Volume: 33mL Perfusion (Tmax>6.0s) volume: 33mL Mismatch Volume: 41mL Infarction Location:Negative CT perfusion with no evidence for acute ischemia or other perfusion deficit. IMPRESSION: 1. Negative CTA for emergent large vessel occlusion. 2. Negative CT perfusion with no evidence for acute ischemia or other perfusion deficit. 3. Atheromatous change about the carotid bifurcations bilaterally with associated stenoses of up to 60-70% bilaterally. 4. Moderate to severe atherosclerotic change throughout the carotid siphons with associated moderate to severe multifocal stenoses. 5. Occlusion of the right vertebral artery at its origin, with multifocal severe proximal left V1 and distal left V4 stenoses. 6. Focal moderate to severe proximal left M2 stenosis, inferior division. 7. 4 mm  left lower lobe nodule, indeterminate. Consider follow-up examination with dedicated chest CT for further evaluation. Critical Value/emergent results were called by telephone at the time of interpretation on 10/13/2020 at 8:28 pm to provider [facility] , who verbally acknowledged these results. Electronically Signed   By: Rise Mu M.D.   On: 10/13/2020 20:34   CT Head Wo Contrast  Result Date: 10/13/2020 CLINICAL DATA:  85 year old male with neurologic deficit. EXAM: CT HEAD WITHOUT CONTRAST TECHNIQUE: Contiguous axial images were obtained from the base of the skull through the vertex without intravenous contrast. COMPARISON:  None. FINDINGS: Brain: Mild age-related atrophy and moderate chronic microvascular ischemic changes. There is no acute intracranial hemorrhage. No mass effect midline shift. No extra-axial fluid collection. Vascular: Apparent high attenuating focus in the M2 segment of the right MCA (12/2). This is likely artifactual and related to hemoconcentration/dehydration. Similar slightly higher attenuation of the intracranial vasculature noted. If there is clinical concern for right MCA territory infarct, further evaluation with CT is recommended. Skull: Normal. Negative for fracture or focal lesion. Sinuses/Orbits: Mild mucoperiosteal thickening of paranasal sinuses and a small left maxillary sinus retention cyst or polyp. No air-fluid level. The mastoid air cells are clear. Other: None IMPRESSION: 1. No acute intracranial hemorrhage. 2. Age-related atrophy and chronic microvascular ischemic changes. Electronically Signed  By: Elgie Collard M.D.   On: 10/13/2020 16:13   CT Angio Neck W and/or Wo Contrast  Result Date: 10/13/2020 CLINICAL DATA:  Initial evaluation for neuro deficit, stroke suspected. EXAM: CT ANGIOGRAPHY HEAD AND NECK CT PERFUSION BRAIN TECHNIQUE: Multidetector CT imaging of the head and neck was performed using the standard protocol during bolus administration  of intravenous contrast. Multiplanar CT image reconstructions and MIPs were obtained to evaluate the vascular anatomy. Carotid stenosis measurements (when applicable) are obtained utilizing NASCET criteria, using the distal internal carotid diameter as the denominator. Multiphase CT imaging of the brain was performed following IV bolus contrast injection. Subsequent parametric perfusion maps were calculated using RAPID software. CONTRAST:  OMNIPAQUE IOHEXOL 350 MG/ML SOLN COMPARISON:  Prior CT from earlier the same day. FINDINGS: CTA NECK FINDINGS Aortic arch: Visualized aortic arch normal in caliber with normal 3 vessel morphology. Mild atheromatous change about the arch itself. No hemodynamically significant stenosis about the origin of the great vessels. Visualized subclavian arteries patent. Right carotid system: Right CCA patent from its origin to the bifurcation without stenosis. Scattered calcified and noncalcified plaque about the right carotid bulb/proximal right ICA with associated stenosis of up to 60-70% by NASCET criteria. Right ICA patent distally to the skull base without stenosis, dissection or occlusion. Left carotid system: Left CCA patent from its origin to the bifurcation without stenosis. Scattered mixed plaque about the left carotid bulb/proximal left ICA with associated stenosis of up to 60-70% by NASCET criteria. Left ICA patent distally without stenosis, dissection or occlusion. Vertebral arteries: Both vertebral arteries arise from the subclavian arteries. 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 Scant distal reconstitution at the right V2 segment, with subsequent reocclusion by the skull base. Multifocal severe stenoses noted involving the proximal left V1 segment. Left vertebral artery otherwise patent to the skull base without stenosis or other acute vascular abnormality. Skeleton: No visible acute osseous finding. No discrete or worrisome osseous lesions. Other neck: No  other acute soft tissue abnormality within the neck. 1.2 cm left thyroid nodule noted, felt to be of doubtful significance given size and patient age. No follow-up imaging recommended (ref: J Am Coll Radiol. 2015 Feb;12(2): 143-50). Upper chest: 4 mm nodule partially visualized at the superior segment of the left lower lobe (series 5, image 1). Visualized upper chest demonstrates no other acute finding. Review of the MIP images confirms the above findings CTA HEAD FINDINGS Anterior circulation: Petrous segments patent bilaterally. Scattered atheromatous plaque throughout the carotid siphons with associated moderate to severe multifocal stenoses. A1 segments patent bilaterally. Normal anterior communicating artery complex. Anterior cerebral arteries patent to their distal aspects without stenosis. M1 segments patent bilaterally. Normal MCA bifurcations. Focal moderate to severe proximal left M2 stenosis, inferior division (series 7, image 107). Left MCA branches well perfused distally. No proximal right MCA branch occlusion or stenosis. Diffuse small vessel atheromatous irregularity. Posterior circulation: Left vertebral artery patent as it courses into the cranial vault. Left PICA origin patent and normal. Focal moderate to severe distal left V4 stenosis (series 7, image 129). Right vertebral artery occluded at the skull base. Right PICA not seen. Basilar diminutive and mildly irregular but patent to its distal aspect without high-grade stenosis. Superior cerebral arteries patent bilaterally. Left PCA primarily supplied via the basilar. Right PCA supplied via the basilar as well as a robust right posterior communicating artery. Both PCAs widely patent to their distal aspects. Venous sinuses: Patent allowing for timing the contrast bolus. Anatomic variants: None  significant.  No aneurysm. Review of the MIP images confirms the above findings CT Brain Perfusion Findings: ASPECTS: Not performed. CBF (<30%) Volume: 0mL  Perfusion (Tmax>6.0s) volume: 0mL Mismatch Volume: 0mL Infarction Location:Negative CT perfusion with no evidence for acute ischemia or other perfusion deficit. IMPRESSION: 1. Negative CTA for emergent large vessel occlusion. 2. Negative CT perfusion with no evidence for acute ischemia or other perfusion deficit. 3. Atheromatous change about the carotid bifurcations bilaterally with associated stenoses of up to 60-70% bilaterally. 4. Moderate to severe atherosclerotic change throughout the carotid siphons with associated moderate to severe multifocal stenoses. 5. Occlusion of the right vertebral artery at its origin, with multifocal severe proximal left V1 and distal left V4 stenoses. 6. Focal moderate to severe proximal left M2 stenosis, inferior division. 7. 4 mm left lower lobe nodule, indeterminate. Consider follow-up examination with dedicated chest CT for further evaluation. Critical Value/emergent results were called by telephone at the time of interpretation on 10/13/2020 at 8:28 pm to provider [facility] , who verbally acknowledged these results. Electronically Signed   By: Rise Mu M.D.   On: 10/13/2020 20:34   CT CEREBRAL PERFUSION W CONTRAST  Result Date: 10/13/2020 CLINICAL DATA:  Initial evaluation for neuro deficit, stroke suspected. EXAM: CT ANGIOGRAPHY HEAD AND NECK CT PERFUSION BRAIN TECHNIQUE: Multidetector CT imaging of the head and neck was performed using the standard protocol during bolus administration of intravenous contrast. Multiplanar CT image reconstructions and MIPs were obtained to evaluate the vascular anatomy. Carotid stenosis measurements (when applicable) are obtained utilizing NASCET criteria, using the distal internal carotid diameter as the denominator. Multiphase CT imaging of the brain was performed following IV bolus contrast injection. Subsequent parametric perfusion maps were calculated using RAPID software. CONTRAST:  OMNIPAQUE IOHEXOL 350 MG/ML SOLN  COMPARISON:  Prior CT from earlier the same day. FINDINGS: CTA NECK FINDINGS Aortic arch: Visualized aortic arch normal in caliber with normal 3 vessel morphology. Mild atheromatous change about the arch itself. No hemodynamically significant stenosis about the origin of the great vessels. Visualized subclavian arteries patent. Right carotid system: Right CCA patent from its origin to the bifurcation without stenosis. Scattered calcified and noncalcified plaque about the right carotid bulb/proximal right ICA with associated stenosis of up to 60-70% by NASCET criteria. Right ICA patent distally to the skull base without stenosis, dissection or occlusion. Left carotid system: Left CCA patent from its origin to the bifurcation without stenosis. Scattered mixed plaque about the left carotid bulb/proximal left ICA with associated stenosis of up to 60-70% by NASCET criteria. Left ICA patent distally without stenosis, dissection or occlusion. Vertebral arteries: Both vertebral arteries arise from the subclavian arteries. 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 Scant distal reconstitution at the right V2 segment, with subsequent reocclusion by the skull base. Multifocal severe stenoses noted involving the proximal left V1 segment. Left vertebral artery otherwise patent to the skull base without stenosis or other acute vascular abnormality. Skeleton: No visible acute osseous finding. No discrete or worrisome osseous lesions. Other neck: No other acute soft tissue abnormality within the neck. 1.2 cm left thyroid nodule noted, felt to be of doubtful significance given size and patient age. No follow-up imaging recommended (ref: J Am Coll Radiol. 2015 Feb;12(2): 143-50). Upper chest: 4 mm nodule partially visualized at the superior segment of the left lower lobe (series 5, image 1). Visualized upper chest demonstrates no other acute finding. Review of the MIP images confirms the above findings CTA HEAD FINDINGS Anterior  circulation:  Petrous segments patent bilaterally. Scattered atheromatous plaque throughout the carotid siphons with associated moderate to severe multifocal stenoses. A1 segments patent bilaterally. Normal anterior communicating artery complex. Anterior cerebral arteries patent to their distal aspects without stenosis. M1 segments patent bilaterally. Normal MCA bifurcations. Focal moderate to severe proximal left M2 stenosis, inferior division (series 7, image 107). Left MCA branches well perfused distally. No proximal right MCA branch occlusion or stenosis. Diffuse small vessel atheromatous irregularity. Posterior circulation: Left vertebral artery patent as it courses into the cranial vault. Left PICA origin patent and normal. Focal moderate to severe distal left V4 stenosis (series 7, image 129). Right vertebral artery occluded at the skull base. Right PICA not seen. Basilar diminutive and mildly irregular but patent to its distal aspect without high-grade stenosis. Superior cerebral arteries patent bilaterally. Left PCA primarily supplied via the basilar. Right PCA supplied via the basilar as well as a robust right posterior communicating artery. Both PCAs widely patent to their distal aspects. Venous sinuses: Patent allowing for timing the contrast bolus. Anatomic variants: None significant.  No aneurysm. Review of the MIP images confirms the above findings CT Brain Perfusion Findings: ASPECTS: Not performed. CBF (<30%) Volume: 0mL Perfusion (Tmax>6.0s) volume: 0mL Mismatch Volume: 0mL Infarction Location:Negative CT perfusion with no evidence for acute ischemia or other perfusion deficit. IMPRESSION: 1. Negative CTA for emergent large vessel occlusion. 2. Negative CT perfusion with no evidence for acute ischemia or other perfusion deficit. 3. Atheromatous change about the carotid bifurcations bilaterally with associated stenoses of up to 60-70% bilaterally. 4. Moderate to severe atherosclerotic change  throughout the carotid siphons with associated moderate to severe multifocal stenoses. 5. Occlusion of the right vertebral artery at its origin, with multifocal severe proximal left V1 and distal left V4 stenoses. 6. Focal moderate to severe proximal left M2 stenosis, inferior division. 7. 4 mm left lower lobe nodule, indeterminate. Consider follow-up examination with dedicated chest CT for further evaluation. Critical Value/emergent results were called by telephone at the time of interpretation on 10/13/2020 at 8:28 pm to provider [facility] , who verbally acknowledged these results. Electronically Signed   By: Rise Mu M.D.   On: 10/13/2020 20:34    EKG: Independently reviewed.  Assessment/Plan Principal Problem:   Hypertensive urgency Active Problems:   TIA (transient ischemic attack)    1. HTN urgency - 1. Goal SBP should be around 200 for the moment 2. Hydralazine 2-5mg  Q4H PRN ordered 3. Avoid SBP < 140. 2. TIA - 1. Due to abrupt BP drop. 2. TIA pathway 3. Tele monitor 4. MRI 5. 2d echo 6. CTA head and neck performed 7. Neuro consult 8. ASA 9. Avoid SBP < 140  DVT prophylaxis: Lovenox Code Status: Full Family Communication: No family in room Disposition Plan: Home after BP controlled and TIA workup Consults called: Neuro Admission status: Place in obs    Benjy Kana M. DO [facility]  How to contact the [facility] Attending or Consulting provider 7A - 7P or covering provider during after hours 7P -7A, for this patient?  1. Check the care team in [facility] and look for a) attending/consulting [facility] provider listed and b) the [facility] team listed 2. Log into [email or web address]  [employer] Physician Scheduling and messaging for groups and whole hospitals  On call and physician scheduling software for group practices, residents, hospitalists and other medical providers for call, clinic, rotation and shift schedules. On[employer] is a hospital-wide system for scheduling  doctors and paging doctors on call. EasyPlot is  for scientific plotting and data analysis.  [email or web address]  and use [place]'s universal password to access. If you do not have the password, please contact the hospital operator.  3. Locate the [facility] provider you are looking for under [facility] and page to a number that you can be directly reached. 4. If you still have difficulty reaching the provider, please page the [facility] (Director on Call) for the Hospitalists listed on amion for assistance.  10/13/2020, 11:01 PM

## 2020-10-13 NOTE — ED Provider Notes (Addendum)
[facility] EMERGENCY DEPT Provider Note   CSN: [id number] Arrival date & time: 10/13/20  1450     History Chief Complaint  Patient presents with  . Dizziness    Charles Porter is a 85 y.o. male.  HPI Patient reports that he is dizzy.  It started 4 days ago.  He reports on Wednesday he felt like things were kind of moving and spinning he reports that he was off balance and needed to hold onto things.  Patient did go to work and was having trouble managing.  He reports by Thursday it had gone away and he was better.  Then it was back again on Friday.  He called off work on Friday.  He went to the pharmacy and try some over-the-counter medications for dizziness with no relief.  The day patient went to the urgent care and was found to be severely hypertensive with dizziness and referred to the emergency department.  Patient denies he is having any headaches.  He has not noted any visual changes.  He denies any focal weakness numbness or tingling.  He is using a cane now to help with balance.  He reports normally he has no issues with balance and walks and is active without difficulty.  Patient did not realize he was so hypertensive.  He reports the last time he has a blood pressure check might have been 7 years ago.  He has felt well recently no fevers no chills.  No problems with headaches, chest pain or shortness of breath.    History reviewed. No pertinent past medical history.  There are no problems to display for this patient.   No past surgical history on file.     No family history on file.  Social History   Tobacco Use  . Smoking status: Former Smoker    Types: Cigarettes  . Smokeless tobacco: Never Used  Substance Use Topics  . Alcohol use: No  . Drug use: No    Home Medications Prior to Admission medications   Not on File    Allergies    Patient has no known allergies.  Review of Systems   Review of Systems 10 systems reviewed and negative except  as per HPI Physical Exam Updated Vital Signs BP (!) 249/81 (BP Location: Right Arm)   Pulse (!) 58   Temp 97.7 F (36.5 C) (Oral)   Resp 16   SpO2 100%   Physical Exam Constitutional:      Appearance: He is well-developed.  HENT:     Head: Normocephalic and atraumatic.     Right Ear: Tympanic membrane normal.     Left Ear: Tympanic membrane normal.     Mouth/Throat:     Mouth: Mucous membranes are moist.     Pharynx: Oropharynx is clear.  Eyes:     Extraocular Movements: Extraocular movements intact.     Pupils: Pupils are equal, round, and reactive to light.  Cardiovascular:     Rate and Rhythm: Normal rate and regular rhythm.     Heart sounds: Normal heart sounds.  Pulmonary:     Effort: Pulmonary effort is normal.     Breath sounds: Normal breath sounds.  Abdominal:     General: Bowel sounds are normal. There is no distension.     Palpations: Abdomen is soft.     Tenderness: There is no abdominal tenderness.  Musculoskeletal:        General: Normal range of motion.     Cervical  back: Neck supple.  Skin:    General: Skin is warm and dry.  Neurological:     General: No focal deficit present.     Mental Status: He is alert and oriented to person, place, and time.     GCS: GCS eye subscore is 4. GCS verbal subscore is 5. GCS motor subscore is 6.     Cranial Nerves: No cranial nerve deficit.     Sensory: No sensory deficit.     Motor: No weakness.     Coordination: Coordination normal.     Gait: Gait normal.     Comments: Normal cognitive function.  Speech is clear.  Normal finger-nose exam.  Patient is able to stand and ambulate with a coordinated gait.     ED Results / Procedures / Treatments   Labs (all labs ordered are listed, but only abnormal results are displayed) Labs Reviewed  COMPREHENSIVE METABOLIC PANEL - Abnormal; Notable for the following components:      Result Value   BUN 26 (*)    AST 44 (*)    All other components within normal limits   URINALYSIS, ROUTINE W REFLEX MICROSCOPIC - Abnormal; Notable for the following components:   Ketones, ur 40 (*)    Protein, ur 30 (*)    All other components within normal limits  TROPONIN I (HIGH SENSITIVITY) - Abnormal; Notable for the following components:   Troponin I (High Sensitivity) 67 (*)    All other components within normal limits  RESP PANEL BY RT-PCR (FLU A&B, COVID) ARPGX2  CBC WITH DIFFERENTIAL/PLATELET  PROTIME-INR  TROPONIN I (HIGH SENSITIVITY)    EKG None EKG not importing from use.  Normal sinus rhythm.  No acute ischemic appearance.  No significant change from previous  Radiology CT Head Wo Contrast  Result Date: 10/13/2020 CLINICAL DATA:  85 year old male with neurologic deficit. EXAM: CT HEAD WITHOUT CONTRAST TECHNIQUE: Contiguous axial images were obtained from the base of the skull through the vertex without intravenous contrast. COMPARISON:  None. FINDINGS: Brain: Mild age-related atrophy and moderate chronic microvascular ischemic changes. There is no acute intracranial hemorrhage. No mass effect midline shift. No extra-axial fluid collection. Vascular: Apparent high attenuating focus in the M2 segment of the right MCA (12/2). This is likely artifactual and related to hemoconcentration/dehydration. Similar slightly higher attenuation of the intracranial vasculature noted. If there is clinical concern for right MCA territory infarct, further evaluation with CT is recommended. Skull: Normal. Negative for fracture or focal lesion. Sinuses/Orbits: Mild mucoperiosteal thickening of paranasal sinuses and a small left maxillary sinus retention cyst or polyp. No air-fluid level. The mastoid air cells are clear. Other: None IMPRESSION: 1. No acute intracranial hemorrhage. 2. Age-related atrophy and chronic microvascular ischemic changes. Electronically Signed   By: Elgie Collard M.D.   On: 10/13/2020 16:13    Procedures Procedures  CRITICAL CARE Performed by: Arby Barrette   Total critical care time: 60 minutes  Critical care time was exclusive of separately billable procedures and treating other patients.  Critical care was necessary to treat or prevent imminent or life-threatening deterioration.  Critical care was time spent personally by me on the following activities: development of treatment plan with patient and/or surrogate as well as nursing, discussions with consultants, evaluation of patient's response to treatment, examination of patient, obtaining history from patient or surrogate, ordering and performing treatments and interventions, ordering and review of laboratory studies, ordering and review of radiographic studies, pulse oximetry and re-evaluation of patient's condition. Medications  Ordered in ED Medications  hydrALAZINE (APRESOLINE) injection 10 mg (has no administration in time range)    ED Course  I have reviewed the triage vital signs and the nursing notes.  Pertinent labs & imaging results that were available during my care of the patient were reviewed by me and considered in my medical decision making (see chart for details).  Clinical Course as of 10/24/20 0656  Sat Oct 13, 2020  1909 Update, at 645 patient was given 10 mg of hydralazine IV for blood pressures 240/84.  Within 5 minutes of hydralazine administration patient became very dizzy and speech slurred.  I reassessed the patient and note there is dramatic change in his neurologic exam.  Patient has a distinct left facial droop and slurred speech.  He is following commands appropriately.  He will perform a grip strength which is bilaterally 5 out of 5.  He follows commands to hold both arms up but is slow and somewhat weak to do so.  He can by command elevate each leg off of the bed and hold against resistance but not with the same strength he had previously.  Exam of confrontational testing for visual fields does not suggest a field cut.  Patient does have nystagmus now.   Blood pressure dropped precipitously with 10 mg of hydralazine from systolic 240 to 110.  Findings very concerning for hypoperfusion of stroke territory.  Patient is upgraded to code stroke with time of onset of symptoms 6: 50 p.m.  Initial recommendation from telestroke is for CT angiogram.  [MP]  2057 I have been working with Dr. Bing Neighbors neurology for code stroke via tele visit.  She has reviewed the results of CT angiograms and perfusion studies.  No interventional lesions identified.  She advises patient is not a TPA candidate due to symptoms first onset 4 days ago. [MP]  2058 Patient's blood pressures have [MP]  2058 Patient's BPs have  rebounded to 160s/80s.  He has had improvement in mentation.  However, he has persistent stroke symptoms of a disconjugate left gaze and left mouth droop.  Speech is slurred.  Patient can perform grip strength with both hands but perceives left weakness. [MP]    Clinical Course User Index [MP] Arby Barrette, MD   MDM Rules/Calculators/A&P                         As per notes above, patient had developed vertigo for about 4 days.  With concern for cerebellar CVA, stroke work-up initiated.  Patient was also extremely hypertensive on arrival with systolic blood pressures in the 240s.  With pressures at that elevation, patient's mental status was clear however he did not exhibit any hypertensive encephalopathy.  With a 10 mg dose of hydralazine, he had precipitous drop in blood pressure and exhibited stroke symptoms.  Code stroke initiated as outlined.  Patient transferred to [facility] for continued stroke work-up. Final Clinical Impression(s) / ED Diagnoses Final diagnoses:  Hypertensive urgency  Vertigo    Rx / DC Orders ED Discharge Orders    None       Arby Barrette, MD 10/13/20 1528    Arby Barrette, MD 10/24/20 [phone]

## 2020-10-13 NOTE — ED Provider Notes (Signed)
Patient arrived here in clinic seen in triage for evaluation of worsening dizziness patient was barely able to ambulate to the exam room due to significant dizziness.  Vital signs check indicated hypertensive urgency.  Given severity of elevation in blood pressure, patient is symptomatic given age patient warrants high-level evaluation care.  Patient has been directed to follow-up at [facility]  ER for further work-up and evaluation.  Patient is accompanied by daughter who will transport him to the ER.  Family declined EMS.   Bing Neighbors, FNP 10/13/20 1427

## 2020-10-13 NOTE — Consult Note (Signed)
NEUROLOGY CONSULTATION NOTE   Date of service: October 13, 2020 Patient Name: Charles Porter MRN:  [id number] DOB:  [birth year] Reason for consult: TE[facility]STROKE [facility] acute onset L facial droop and dysarthria at 1900 in man with vertigo x4 days _ _ _   _ __   _ __ _ _  __ __   _ __   __ _  History of Present Illness   Telestroke called by [facility] ED for acute onset L facial droop and dysarthria at 27  85 yo man with unknown past medical history presented to [facility] ED today from urgent care for severe and dizziness x4 days. Patient reported that starting 4 days ago he began feeling off balance and having dizziness described as fluctuating room spinning so that he had to hold onto walls or other things to keep from falling. He has been ambulating with a cane for the past 4 days although he does not usually require one. His dizziness did not improve so he presented to urgent care today where he was found to be severely hypertensive and was referred to ED. He denied headache, visual changes, focal weakness or numbness. No CP, SOB, or s/s infection. It is unknown if he takes any medications  BP on arrival 240/84 with HR 45 RR 14 temp 36.5 O2 99%. He was given a single dose of hydralazine which dropped his SBP into the 110s at which point he developed new L facial droop and dysarthria and began vomiting. Telestroke was called at that time.  Pt outside the window for tPA given LKW 4 days ago (dizziness since then)  CT head noncon NAICP  NIHSS = 5 1 VF, 1 facial droop, 1 LLE drift, 1 ataxia, 1 dysarthria  Symptoms mildly improved but did not resolve after SBP spontaneously improved > 150  CTA/CTP 1. Negative CTA for emergent large vessel occlusion. 2. Negative CT perfusion with no evidence for acute ischemia or other perfusion deficit. 3. Atheromatous change about the carotid bifurcations bilaterally with associated stenoses of up to 60-70% bilaterally. 4. Moderate to severe  atherosclerotic change throughout the carotid siphons with associated moderate to severe multifocal stenoses. 5. Occlusion of the right vertebral artery at its origin, with multifocal severe proximal left V1 and distal left V4 stenoses. 6. Focal moderate to severe proximal left M2 stenosis, inferior division. 7. 4 mm left lower lobe nodule, indeterminate. Consider follow-up examination with dedicated chest CT for further evaluation.  CNS imaging personally reviewed and I agree with above interpretation.   No intervention indicated 2/2 no acute LVO   ROS   10 point review of systems was performed and was negative except as described in HPI.  Past History   History reviewed. No pertinent past medical history. No past surgical history on file. No family history on file. Social History   Socioeconomic History  . Marital status: Married    Spouse name: Not on file  . Number of children: Not on file  . Years of education: Not on file  . Highest education level: Not on file  Occupational History  . Not on file  Tobacco Use  . Smoking status: Former Smoker    Types: Cigarettes  . Smokeless tobacco: Never Used  Substance and Sexual Activity  . Alcohol use: No  . Drug use: No  . Sexual activity: Not on file  Other Topics Concern  . Not on file  Social History Narrative  . Not on file   Social Determinants  of Health   Financial Resource Strain: Not on file  Food Insecurity: Not on file  Transportation Needs: Not on file  Physical Activity: Not on file  Stress: Not on file  Social Connections: Not on file   No Known Allergies  Medications   (Not in a hospital admission)    Vitals   Vitals:   10/13/20 1745 10/13/20 1800 10/13/20 1815 10/13/20 1828  BP: (!) 218/79 (!) 217/79 (!) 240/84 (!) 240/84  Pulse: (!) 54 (!) 58 (!) 45   Resp:  18 14   Temp:      TempSrc:      SpO2: 99% 99% 99%      There is no height or weight on file to calculate BMI.  Physical  Exam   Exam performed over videoconference  Physical Exam Gen: A&O x4, distressed while episodically vomiting HEENT: Atraumatic, normocephalic Resp: normal work of breathing CV: extremities appear well-perfused  Neuro: *MS: A&O x4. Follows multi-step commands.  *Speech: fluid, mild dysarthria, naming and repetition intact *CN:    I: Deferred   II,III: PERRLA, visual acuity reduced in all quadrants of R eye compared to L   III,IV,VI: EOMI    V: Sensation intact from V1 to V3 to LT   VII: Eyelid closure was full.  L UMN facial droop   VIII: Hearing intact to voice  *Motor:   Normal bulk.  No tremor, rigidity or bradykinesia. LUE with mild drift, no drift in other extremities *Sensory: SILT. No double-simultaneous extinction.  *Coordination:  Dysmetria on L FNF, R intact *Reflexes:  UTA 2/2 telestroke *Gait: deferred  NIHSS = 5 1 VF, 1 facial droop, 1 LLE drift, 1 ataxia, 1 dysarthria   Labs   CBC:  Recent Labs  Lab 10/13/20 1637  WBC 7.4  NEUTROABS 4.3  HGB 14.9  HCT 45.7  MCV 93.3  PLT 269    Basic Metabolic Panel:  Lab Results  Component Value Date   NA 138 10/13/2020   K 4.3 10/13/2020   CO2 25 10/13/2020   GLUCOSE 85 10/13/2020   BUN 26 (H) 10/13/2020   CREATININE 1.15 10/13/2020   CALCIUM 9.3 10/13/2020   GFRNONAA >60 10/13/2020   GFRAA 56 (L) 04/03/2016   Lipid Panel: No results found for: LDLCALC HgbA1c: No results found for: HGBA1C Urine Drug Screen: No results found for: LABOPIA, COCAINSCRNUR, LABBENZ, AMPHETMU, THCU, LABBARB  Alcohol Level No results found for: [facility]   Impression   85 yo [facility] with hypertensive emergency SBP 240 with vertigo x4 days. Upon administration of hydralazine at which point he developed new L facial droop and dysarthria and telestroke was called. tPA was not administered 2/2 presentation outside the window. 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 There was no emergent large vessel occlusion therefore no intervention indicated. No acute infarct on CT perfusion. He will be transferred to neurohospitalist service at [facility] where stroke team will follow consulting.   Recommendations   - Admit to hospitalist service at [facility] for mgmt of hypertensive emergency, stroke team will see tomorrow in consultation - Strict avoidance of hypotension given severe vertebrobasilar insuffiency; goal SBP > 140 - MRI brain wo contrast - TTE no bubble - Check A1c and LDL + add statin per guidelines - ASA 324mg  now f/b ASA 81mg  daily - q4 hr neuro checks - STAT head CT for any change in  neuro exam - Tele - PT/OT/SLP - Stroke education - Amb referral to neurology upon discharge  ______________________________________________________________________   Thank you for the opportunity to take part in the care of this patient. If you have any further questions, please contact the neurology consultation attending.  Signed,  Bing Neighbors, MD [facility] [phone]  If 7pm- 7am, please page neurology on call as listed in AMION.

## 2020-10-13 NOTE — ED Provider Notes (Incomplete Revision)
[facility] EMERGENCY DEPT Provider Note   CSN: [id number] Arrival date & time: 10/13/20  1450     History Chief Complaint  Patient presents with  . Dizziness    Charles Porter is a 85 y.o. male.  HPI Patient reports that he is dizzy.  It started 4 days ago.  He reports on Wednesday he felt like things were kind of moving and spinning he reports that he was off balance and needed to hold onto things.  Patient did go to work and was having trouble managing.  He reports by Thursday it had gone away and he was better.  Then it was back again on Friday.  He called off work on Friday.  He went to the pharmacy and try some over-the-counter medications for dizziness with no relief.  The day patient went to the urgent care and was found to be severely hypertensive with dizziness and referred to the emergency department.  Patient denies he is having any headaches.  He has not noted any visual changes.  He denies any focal weakness numbness or tingling.  He is using a cane now to help with balance.  He reports normally he has no issues with balance and walks and is active without difficulty.  Patient did not realize he was so hypertensive.  He reports the last time he has a blood pressure check might have been 7 years ago.  He has felt well recently no fevers no chills.  No problems with headaches, chest pain or shortness of breath.    History reviewed. No pertinent past medical history.  There are no problems to display for this patient.   No past surgical history on file.     No family history on file.  Social History   Tobacco Use  . Smoking status: Former Smoker    Types: Cigarettes  . Smokeless tobacco: Never Used  Substance Use Topics  . Alcohol use: No  . Drug use: No    Home Medications Prior to Admission medications   Not on File    Allergies    Patient has no known allergies.  Review of Systems   Review of Systems 10 systems reviewed and negative except  as per HPI Physical Exam Updated Vital Signs BP (!) 249/81 (BP Location: Right Arm)   Pulse (!) 58   Temp 97.7 F (36.5 C) (Oral)   Resp 16   SpO2 100%   Physical Exam Constitutional:      Appearance: He is well-developed.  HENT:     Head: Normocephalic and atraumatic.     Right Ear: Tympanic membrane normal.     Left Ear: Tympanic membrane normal.     Mouth/Throat:     Mouth: Mucous membranes are moist.     Pharynx: Oropharynx is clear.  Eyes:     Extraocular Movements: Extraocular movements intact.     Pupils: Pupils are equal, round, and reactive to light.  Cardiovascular:     Rate and Rhythm: Normal rate and regular rhythm.     Heart sounds: Normal heart sounds.  Pulmonary:     Effort: Pulmonary effort is normal.     Breath sounds: Normal breath sounds.  Abdominal:     General: Bowel sounds are normal. There is no distension.     Palpations: Abdomen is soft.     Tenderness: There is no abdominal tenderness.  Musculoskeletal:        General: Normal range of motion.     Cervical  back: Neck supple.  Skin:    General: Skin is warm and dry.  Neurological:     General: No focal deficit present.     Mental Status: He is alert and oriented to person, place, and time.     GCS: GCS eye subscore is 4. GCS verbal subscore is 5. GCS motor subscore is 6.     Cranial Nerves: No cranial nerve deficit.     Sensory: No sensory deficit.     Motor: No weakness.     Coordination: Coordination normal.     Gait: Gait normal.     Comments: Normal cognitive function.  Speech is clear.  Normal finger-nose exam.  Patient is able to stand and ambulate with a coordinated gait.     ED Results / Procedures / Treatments   Labs (all labs ordered are listed, but only abnormal results are displayed) Labs Reviewed  COMPREHENSIVE METABOLIC PANEL - Abnormal; Notable for the following components:      Result Value   BUN 26 (*)    AST 44 (*)    All other components within normal limits   URINALYSIS, ROUTINE W REFLEX MICROSCOPIC - Abnormal; Notable for the following components:   Ketones, ur 40 (*)    Protein, ur 30 (*)    All other components within normal limits  TROPONIN I (HIGH SENSITIVITY) - Abnormal; Notable for the following components:   Troponin I (High Sensitivity) 67 (*)    All other components within normal limits  RESP PANEL BY RT-PCR (FLU A&B, COVID) [id number]  CBC WITH DIFFERENTIAL/PLATELET  PROTIME-INR  TROPONIN I (HIGH SENSITIVITY)    EKG None  Radiology CT Head Wo Contrast  Result Date: 10/13/2020 CLINICAL DATA:  85 year old male with neurologic deficit. EXAM: CT HEAD WITHOUT CONTRAST TECHNIQUE: Contiguous axial images were obtained from the base of the skull through the vertex without intravenous contrast. COMPARISON:  None. FINDINGS: Brain: Mild age-related atrophy and moderate chronic microvascular ischemic changes. There is no acute intracranial hemorrhage. No mass effect midline shift. No extra-axial fluid collection. Vascular: Apparent high attenuating focus in the M2 segment of the right MCA (12/2). This is likely artifactual and related to hemoconcentration/dehydration. Similar slightly higher attenuation of the intracranial vasculature noted. If there is clinical concern for right MCA territory infarct, further evaluation with CT is recommended. Skull: Normal. Negative for fracture or focal lesion. Sinuses/Orbits: Mild mucoperiosteal thickening of paranasal sinuses and a small left maxillary sinus retention cyst or polyp. No air-fluid level. The mastoid air cells are clear. Other: None IMPRESSION: 1. No acute intracranial hemorrhage. 2. Age-related atrophy and chronic microvascular ischemic changes. Electronically Signed   By: Elgie Collard M.D.   On: 10/13/2020 16:13    Procedures Procedures   Medications Ordered in ED Medications  hydrALAZINE (APRESOLINE) injection 10 mg (has no administration in time range)    ED Course  I have reviewed  the triage vital signs and the nursing notes.  Pertinent labs & imaging results that were available during my care of the patient were reviewed by me and considered in my medical decision making (see chart for details).  Clinical Course as of 10/13/20 1933  Sat Oct 13, 2020  1909 Update, at 645 patient was given 10 mg of hydralazine IV for blood pressures 240/84.  Within 5 minutes of hydralazine administration patient became very dizzy and speech slurred.  I reassessed the patient and note there is dramatic change in his neurologic exam.  Patient has a distinct left facial  droop and slurred speech.  He is following commands appropriately.  He will perform a grip strength which is bilaterally 5 out of 5.  He follows commands to hold both arms up but is slow and somewhat weak to do so.  He can by command elevate each leg off of the bed and hold against resistance but not with the same strength he had previously.  Exam of confrontational testing for visual fields does not suggest a field cut.  Patient does have nystagmus now.  Blood pressure dropped precipitously with 10 mg of hydralazine from systolic 240 to 110.  Findings very concerning for hypoperfusion of stroke territory.  Patient is upgraded to code stroke with time of onset of symptoms 6: 50 p.m.  Initial recommendation from telestroke is for CT angiogram.  [MP]    Clinical Course User Index [MP] Arby Barrette, MD   MDM Rules/Calculators/A&P                          Final Clinical Impression(s) / ED Diagnoses Final diagnoses:  Hypertensive urgency  Vertigo    Rx / DC Orders ED Discharge Orders    None       Arby Barrette, MD 10/13/20 1528

## 2020-10-13 NOTE — ED Triage Notes (Signed)
Pt present dizziness with unable to control his balance. Symptoms started on Wednesday.

## 2020-10-13 NOTE — ED Notes (Signed)
Pt to CT

## 2020-10-13 NOTE — ED Notes (Signed)
Pt's daughter called this nurse in at 1840 and said that he was feeling more dizzy than before. Pt at that time had equal grips, answered questions correctly, and said he simply felt dizzy. Pt began to be more dizzy and kept closing his eyes. Dr. Donnald Garre called to bedside, where she determined the we should call a code stroke. Tele neuro called.

## 2020-10-13 NOTE — ED Notes (Signed)
Daughter Gery Pray states that pt reports being light-headed, dizzy, having vertigo, vomiting since Wednesday. Pt's family states dramamine did not help; he went to UC, and they sent him here because his bp was so high. Daughter states he is about the same today as he was when it started on Wednesday.

## 2020-10-13 NOTE — ED Notes (Signed)
Pt having new neurological defecits at this time. MD at bedside.

## 2020-10-13 NOTE — Progress Notes (Signed)
Requested Rad contact Tele Neuro Dr. Selina Cooley @ [phone]

## 2020-10-13 NOTE — ED Triage Notes (Signed)
He is alert and oriented x 4 with clear speech. He is a bit h.o.h. he c/o intermittent dizziness since this Wed. (three days ago). He states he was seen at an urgent care, who advised him to come here d/t hypertension. He denies any pain or discomfort.

## 2020-10-14 ENCOUNTER — Observation Stay (HOSPITAL_COMMUNITY): Payer: BC Managed Care – PPO

## 2020-10-14 DIAGNOSIS — I10 Essential (primary) hypertension: Secondary | ICD-10-CM | POA: Diagnosis not present

## 2020-10-14 DIAGNOSIS — G459 Transient cerebral ischemic attack, unspecified: Secondary | ICD-10-CM | POA: Diagnosis not present

## 2020-10-14 DIAGNOSIS — R471 Dysarthria and anarthria: Secondary | ICD-10-CM | POA: Diagnosis present

## 2020-10-14 DIAGNOSIS — I161 Hypertensive emergency: Secondary | ICD-10-CM | POA: Diagnosis present

## 2020-10-14 DIAGNOSIS — Z20822 Contact with and (suspected) exposure to covid-19: Secondary | ICD-10-CM | POA: Diagnosis present

## 2020-10-14 DIAGNOSIS — E7800 Pure hypercholesterolemia, unspecified: Secondary | ICD-10-CM | POA: Diagnosis not present

## 2020-10-14 DIAGNOSIS — E860 Dehydration: Secondary | ICD-10-CM | POA: Diagnosis present

## 2020-10-14 DIAGNOSIS — R001 Bradycardia, unspecified: Secondary | ICD-10-CM | POA: Diagnosis not present

## 2020-10-14 DIAGNOSIS — Z87891 Personal history of nicotine dependence: Secondary | ICD-10-CM | POA: Diagnosis not present

## 2020-10-14 DIAGNOSIS — Z79899 Other long term (current) drug therapy: Secondary | ICD-10-CM | POA: Diagnosis not present

## 2020-10-14 DIAGNOSIS — I6340 Cerebral infarction due to embolism of unspecified cerebral artery: Secondary | ICD-10-CM | POA: Diagnosis present

## 2020-10-14 DIAGNOSIS — R112 Nausea with vomiting, unspecified: Secondary | ICD-10-CM | POA: Diagnosis present

## 2020-10-14 DIAGNOSIS — E782 Mixed hyperlipidemia: Secondary | ICD-10-CM | POA: Diagnosis not present

## 2020-10-14 DIAGNOSIS — I6389 Other cerebral infarction: Secondary | ICD-10-CM | POA: Diagnosis not present

## 2020-10-14 DIAGNOSIS — I5032 Chronic diastolic (congestive) heart failure: Secondary | ICD-10-CM | POA: Diagnosis present

## 2020-10-14 DIAGNOSIS — E041 Nontoxic single thyroid nodule: Secondary | ICD-10-CM | POA: Diagnosis present

## 2020-10-14 DIAGNOSIS — I639 Cerebral infarction, unspecified: Secondary | ICD-10-CM | POA: Diagnosis not present

## 2020-10-14 DIAGNOSIS — E785 Hyperlipidemia, unspecified: Secondary | ICD-10-CM | POA: Diagnosis not present

## 2020-10-14 DIAGNOSIS — H5509 Other forms of nystagmus: Secondary | ICD-10-CM | POA: Diagnosis present

## 2020-10-14 DIAGNOSIS — I160 Hypertensive urgency: Secondary | ICD-10-CM | POA: Diagnosis not present

## 2020-10-14 DIAGNOSIS — K5901 Slow transit constipation: Secondary | ICD-10-CM | POA: Diagnosis not present

## 2020-10-14 DIAGNOSIS — R7303 Prediabetes: Secondary | ICD-10-CM | POA: Diagnosis present

## 2020-10-14 DIAGNOSIS — R29810 Facial weakness: Secondary | ICD-10-CM | POA: Diagnosis present

## 2020-10-14 DIAGNOSIS — I679 Cerebrovascular disease, unspecified: Secondary | ICD-10-CM | POA: Diagnosis not present

## 2020-10-14 DIAGNOSIS — R7401 Elevation of levels of liver transaminase levels: Secondary | ICD-10-CM | POA: Diagnosis not present

## 2020-10-14 DIAGNOSIS — I6501 Occlusion and stenosis of right vertebral artery: Secondary | ICD-10-CM | POA: Diagnosis present

## 2020-10-14 DIAGNOSIS — R42 Dizziness and giddiness: Secondary | ICD-10-CM | POA: Diagnosis present

## 2020-10-14 DIAGNOSIS — I110 Hypertensive heart disease with heart failure: Secondary | ICD-10-CM | POA: Diagnosis present

## 2020-10-14 DIAGNOSIS — I248 Other forms of acute ischemic heart disease: Secondary | ICD-10-CM | POA: Diagnosis present

## 2020-10-14 DIAGNOSIS — Z8673 Personal history of transient ischemic attack (TIA), and cerebral infarction without residual deficits: Secondary | ICD-10-CM | POA: Diagnosis not present

## 2020-10-14 DIAGNOSIS — I69392 Facial weakness following cerebral infarction: Secondary | ICD-10-CM | POA: Diagnosis not present

## 2020-10-14 LAB — ECHOCARDIOGRAM COMPLETE
Area-P 1/2: 1.84 cm2
Height: 66 in
S' Lateral: 1.5 cm
Weight: 2271.62 oz

## 2020-10-14 LAB — LIPID PANEL
Cholesterol: 245 mg/dL — ABNORMAL HIGH (ref 0–200)
HDL: 65 mg/dL (ref >40)
LDL Cholesterol: 165 mg/dL — ABNORMAL HIGH (ref 0–99)
Total CHOL/HDL Ratio: 3.8 RATIO
Triglycerides: 74 mg/dL (ref <150)
VLDL: 15 mg/dL (ref 0–40)

## 2020-10-14 LAB — HEMOGLOBIN A1C
Hgb A1c MFr Bld: 6.2 % — ABNORMAL HIGH (ref 4.8–5.6)
Mean Plasma Glucose: 131.24 mg/dL

## 2020-10-14 MED ORDER — FENTANYL CITRATE (PF) 100 MCG/2ML IJ SOLN
25.0000 ug | Freq: Once | INTRAMUSCULAR | Status: AC
  Administered 2020-10-14: 25 ug via INTRAVENOUS
  Filled 2020-10-14: qty 2

## 2020-10-14 MED ORDER — ATORVASTATIN CALCIUM 80 MG PO TABS
80.0000 mg | ORAL_TABLET | Freq: Every day | ORAL | Status: DC
  Administered 2020-10-14 – 2020-10-17 (×4): 80 mg via ORAL
  Filled 2020-10-14 (×4): qty 1

## 2020-10-14 MED ORDER — AMLODIPINE BESYLATE 10 MG PO TABS
10.0000 mg | ORAL_TABLET | Freq: Every day | ORAL | Status: DC
  Administered 2020-10-14 – 2020-10-17 (×4): 10 mg via ORAL
  Filled 2020-10-14 (×4): qty 1

## 2020-10-14 MED ORDER — CLOPIDOGREL BISULFATE 75 MG PO TABS
75.0000 mg | ORAL_TABLET | Freq: Every day | ORAL | Status: DC
  Administered 2020-10-14 – 2020-10-17 (×4): 75 mg via ORAL
  Filled 2020-10-14 (×4): qty 1

## 2020-10-14 NOTE — Plan of Care (Signed)

## 2020-10-14 NOTE — Progress Notes (Addendum)
STROKE TEAM PROGRESS NOTE   INTERVAL HISTORY His daughter is at the bedside.    85yoM with no known significant PMH who presented with 4 day history of dizziness described as fluctuating room spinning causing him to hold onto walls or other things to keep from falling. He has been ambulating with a cane for the past 4 days although he does not usually require one. His dizziness did not improve so he presented to urgent care where he was found to be severely hypertension.  He was given hydralazine and shortly afterwards his BP dropped.  He developed new L facial droop, dysarthria and visual changes. MRI showed bilateral cerebellar acute ischemic infarcts.  Vitals:   10/14/20 0500 10/14/20 0600 10/14/20 0649 10/14/20 0740  BP: (!) 172/59 (!) 168/59 (!) 193/69 (!) 190/66  Pulse: (!) 56 (!) 54 (!) 59 60  Resp: 13 (!) 9 (!) 22 18  Temp:    97.7 F (36.5 C)  TempSrc:    Oral  SpO2: 98% 99% 97% 97%  Weight:      Height:       CBC:  Recent Labs  Lab 10/13/20 1637  WBC 7.4  NEUTROABS 4.3  HGB 14.9  HCT 45.7  MCV 93.3  PLT 269   Basic Metabolic Panel:  Recent Labs  Lab 10/13/20 1637  NA 138  K 4.3  CL 101  CO2 25  GLUCOSE 85  BUN 26*  CREATININE 1.15  CALCIUM 9.3   Lipid Panel:  Recent Labs  Lab 10/14/20 0105  CHOL 245*  TRIG 74  HDL 65  CHOLHDL 3.8  VLDL 15  LDLCALC 376*   HgbA1c:  Recent Labs  Lab 10/14/20 0105  HGBA1C 6.2*   Urine Drug Screen: No results for input(s): LABOPIA, COCAINSCRNUR, LABBENZ, AMPHETMU, THCU, LABBARB in the last 168 hours.  Alcohol Level No results for input(s): ETH in the last 168 hours.  IMAGING past 24 hours CT Angio Head W or Wo Contrast  Result Date: 10/13/2020 IMPRESSION:  1. Negative CTA for emergent large vessel occlusion.  2. Negative CT perfusion with no evidence for acute ischemia or other perfusion deficit.  3. Atheromatous change about the carotid bifurcations bilaterally with associated stenoses of up to 60-70%  bilaterally.  4. Moderate to severe atherosclerotic change throughout the carotid siphons with associated moderate to severe multifocal stenoses.  5. Occlusion of the right vertebral artery at its origin, with multifocal severe proximal left V1 and distal left V4 stenoses.  6. Focal moderate to severe proximal left M2 stenosis, inferior division. 7. 4 mm left lower lobe nodule, indeterminate.   CT Head Wo Contrast  Result Date: 10/13/2020  IMPRESSION:  1. No acute intracranial hemorrhage.  2. Age-related atrophy and chronic microvascular ischemic changes.   CT Angio Neck W and/or Wo Contrast  Result Date: 10/13/2020 IMPRESSION:  1. Negative CTA for emergent large vessel occlusion.  2. Negative CT perfusion with no evidence for acute ischemia or other perfusion deficit.  3. Atheromatous change about the carotid bifurcations bilaterally with associated stenoses of up to 60-70% bilaterally.  4. Moderate to severe atherosclerotic change throughout the carotid siphons with associated moderate to severe multifocal stenoses.  5. Occlusion of the right vertebral artery at its origin, with multifocal severe proximal left V1 and distal left V4 stenoses.  6. Focal moderate to severe proximal left M2 stenosis, inferior division. 7. 4 mm left lower lobe nodule, indeterminate. Consider follow-up examination with dedicated chest CT for further evaluation.  MR BRAIN WO CONTRAST Result Date: 10/14/2020  IMPRESSION:  1. Patchy small volume acute ischemic infarcts involving the left greater than right cerebellar hemispheres. No associated hemorrhage or mass effect.  2. Age-related cerebral atrophy with moderate chronic microvascular ischemic disease, with multiple remote lacunar infarcts involving the hemispheric cerebral white matter and cerebellum.   CT CEREBRAL PERFUSION W CONTRAST Result Date: 10/13/2020 IMPRESSION:  Negative CT perfusion with no evidence for acute ischemia or other perfusion deficit.    Echo Result Date: 10/14/2020 1. Left ventricular ejection fraction, by estimation, is 60 to 65%. The  left ventricle has normal function. The left ventricle has no regional  wall motion abnormalities. There is severe concentric left ventricular  hypertrophy. Left ventricular diastolic parameters are consistent with Grade I diastolic dysfunction (impaired relaxation).  2. Right ventricular systolic function is normal. The right ventricular  size is normal.  3. The mitral valve is normal in structure. Trivial mitral valve  regurgitation.  4. The aortic valve is tricuspid. There is mild thickening of the aortic  valve. Aortic valve regurgitation is not visualized. No aortic stenosis is  present.  5. The inferior vena cava is normal in size with greater than 50%  respiratory variability, suggesting right atrial pressure of 3 mmHg.   PHYSICAL EXAM Constitutional: Appears well-developed and well-nourished.  Psych: Affect appropriate to situation Eyes: No scleral injection HENT: No OP obstrucion Head: Normocephalic.  Cardiovascular: Normal rate and regular rhythm.  Respiratory: Effort normal and breath sounds normal to anterior ascultation GI: Soft. No distension. There is no tenderness.  Skin: WDI  Neuro: Mental Status: Patient awake, alert,able to follow commands and answer questions appropriately Cranial Nerves: KV:[id number] are equal, round, and reactive to light.  III,IV, LO:VFIEP gaze persistent nystagmus VII: face symmetric VIII: hearing is intact to voice Motor: RUE 5/5 follows commands LUE 4+/5 RLE 5/5 follows commands LLE 5/5 follows commands Sensory: Sensationintact bilaterally Cerebellar: FNF intact bilaterally  ASSESSMENT/PLAN [name] is a 85 y.o. male with no PMH presenting with dizziness and imbalance. Found to have extremely high BP, received hydralazine then developed left facail droop and slurry speech with vomiting. MRI found to have  bilateral cerebellar infarcts.   Stroke:  Left and right cerebellar acute ischemic infarcts likely secondary to large vessel atherosclerosis from b/l VA stenosis/occlusion. However, cardioembolic can not be completely ruled out  CT head No acute intracranial hemorrhage. chronic microvascular ischemic changes  CTA head & neck: Occlusion of the right vertebral artery at its origin, with multifocal severe proximal left V1 and distal left V4 stenoses.  moderate to severe proximal left M2 stenosis.  CT perfusion: no perfusion deficit  MRI  Patchy small volume acute ischemic infarcts involving the left greater than right cerebellar hemispheres.  2D Echo EF 60 to 65%, LVH, grade1 diastolic dysfunction  Recommend 30 day monitor at the time of discharge to eval for atrial fibrillation  LDL 165  HgbA1c 6.2  VTE prophylaxis - Lovenox 40mg  daiy Diet: heart healthy  No antithrombotic prior to admission, now on aspirin 325 mg daily and clopidogrel 75 mg daily for 3 months and then ASA alone given severe b/l VA stenosis/occlusion.   Therapy recommendations:  pending  Disposition:  pending  Multifocal bilateral cerebrovascular stenosis  CTA head and neck showed bilateral ICA 60 to 70% stenosis, bilateral siphon stenosis, left V1 and V4 severe stenosis, right VA occlusion at origin.  Left M2 moderate to severe stenosis.  Avoid low BP  Long-term BP goal  130-150  outpt follow up with [facility]   Hypertension  Near syncope with cerebral hypoperfusion s/p hydralazine   Home meds:  none  Unstable, still high . Currently BP 221/71, will add amlodipine 10mg  daily, gradually lower BP to goal in 3-5 days . Long-term BP goal 130-150 given b/l VA stenosis/occlusion  Hyperlipidemia  Home meds:  none  LDL 165, goal < 70  atorvastatin 80mg  daily  Continue statin at discharge  Other Stroke Risk Factors  Advanced Age >/= 73   Other Active Problems  4 mm left lower lobe lung nodule follow up  with pcp  1.2 cm left thyroid nodule: follow up with PCP  Hospital day # 0  Lissy Olivencia-Simmons, ACNP-BC Stroke Nurse Practitioner 10/14/2020  ATTENDING NOTE: I reviewed above note and agree with the assessment and plan. Pt was seen and examined.   85 year old male with no past medical history not on medication not seeing doctors presented with 4 days history of dizziness and imbalance.  In the ED, found to have extremely high BP, received hydralazine then developed abrupt BP drop found to have left facial droop and slurred speech with vomiting.  Patient transferred to [facility] for evaluation.  Overnight, patient facial droop slurred speech and vomiting resolved, MRI showed bilateral small scattered cerebellar infarcts.  CTA head and neck showed bilateral ICA 60 to 70% stenosis, bilateral siphon stenosis, left VA stenosis right VA occluded at origin, left M2 stenosis.  EF 60 to 65%, LDL 165, A1c 6.2, creatinine 1.15.  On examination, patient daughter at bedside, patient awake alert, orientated x3.  No aphasia, follows simple commands, able to name and repeat.  Visual field full, no gaze preference, however there is right and upper gaze horizontal nystagmus with direction to the right.  Left gaze no nystagmus.  Facial symmetrical, tongue midline.  Bilateral upper extremity lower extremity strength equal bilaterally, FTN and HTS grossly intact bilaterally. Sensation symmetrical.   Patient stroke likely due to severe posterior circulation stenosis/occlusion.  Patient does have also anterior circulation stenosis including ICA bulb and ICA siphon as well as left M2.  Recommend continue aspirin 325 and Plavix 75 for 3 months and then aspirin alone given large vessel intracranial stenosis.  Continue statin.  Patient episode of left facial droop, slurred speech and vomiting with drop in blood pressure likely due to cerebral hypoperfusion secondary to intracranial stenosis.  Recommend avoid low BP, regular  lower BP to goal of 130-150 in 3 to 5 days.  Patient also need to follow-up with vascular surgery for continued monitoring of bilateral ICA stenosis.  Given his age and risk factors, occult A. fib cannot be completely ruled out as potential etiology of his stroke.  Recommend 30-day cardiac event monitoring as outpatient to rule out A. fib.  For detailed assessment and plan, please refer to above as I have made changes wherever appropriate.   89, MD PhD Stroke Neurology 10/14/2020 6:28 PM  Neurology will sign off. Please call with questions. Pt will follow up with stroke clinic NP at [facility] in about 4 weeks. Thanks for the consult.    To contact Stroke Continuity provider, please refer to 12/14/2020. After hours, contact General Neurology

## 2020-10-14 NOTE — Progress Notes (Signed)
*  PRELIMINARY RESULTS* Echocardiogram 2D Echocardiogram has been performed.  Charles Porter 10/14/2020, 11:13 AM

## 2020-10-14 NOTE — Progress Notes (Signed)
[facility] night shift.  The nursing staff reports that the patient is having a headache that has not responded to oral acetaminophen.  NSAIDs are not recommended in the setting of CVA.  Fentanyl 25 mcg IVP x1 dose ordered.  Sanda Klein, MD.

## 2020-10-14 NOTE — Progress Notes (Signed)
PROGRESS NOTE  Charles Porter WNI:[id number] DOB: 01-30-1936 DOA: 10/13/2020 PCP: [name], MD  HPI/Recap of past 24 hours: HPI from Dr Jonette Pesa is a 85 y.o. male with no significant PMH, takes no chronic medications. Doesn't regularly follow with a doctor however. Pt presents to ED with c/o 4 day h/o intermittent dizziness described as feeling off balance and room spinning sensation. Ambulating with use of cane since that time. Due to persistent symptoms pt presented to ED. In the ED pt BP noted to be 249 systolic.  Pt was given 10mg  hydralazine which dropped his BP all the way to 110 systolic and patient then developed acute onset of dysarthria and L facial droop. Pt transferred to [facility] for admission. Since that time his BP has rebounded to 227 and his facial droop and dysarthria have resolved.  Neurology consulted.  Patient admitted for further management.    Today, patient still complaining of dizziness, feels like the room is spinning.  Still reports intermittent headache.  BP still uncontrolled.  Patient denies any chest pain, abdominal pain, nausea/vomiting, fever/chills.   Assessment/Plan: Principal Problem:   Hypertensive urgency Active Problems:   TIA (transient ischemic attack)   Acute ischemic cerebellar infarct Reports dizziness, vertigo, noted nystagmus MRI showed above, L greater than right cerebellar hemisphere.  Multiple remote lacunar infarcts involving the cerebral white matter, cerebellum Negative CTA for emergent large vessel occlusion LDL 165, A1c 6.2 Echo pending Neurology consulted Continue aspirin, Lipitor, Plavix Neuro check, telemetry PT/OT/SLP  Hypertensive crisis Permissive hypertension Goal of SBP greater than 140-160 Monitor closely  Mild troponin elevation Chest pain-free Flat trend, likely demand ischemia from hypertensive crisis EKG with no acute ST changes Echo pending  Hyperlipidemia LDL 165 Start Lipitor      Estimated body mass index is 22.92 kg/m as calculated from the following:   Height as of this encounter: 5\' 6"  (1.676 m).   Weight as of this encounter: 64.4 kg.     Code Status: Full  Family Communication: None at bedside  Disposition Plan: Status is: Inpatient   The patient will require care spanning > 2 midnights and should be moved to inpatient because: Inpatient level of care appropriate due to severity of illness  Dispo: The patient is from: Home              Anticipated d/c is to: Home              Patient currently is not medically stable to d/c.   Difficult to place patient No    Consultants:  Neurology  Procedures:  None  Antimicrobials:  None  DVT prophylaxis: Lovenox   Objective: Vitals:   10/14/20 0600 10/14/20 0649 10/14/20 0740 10/14/20 1209  BP: (!) 168/59 (!) 193/69 (!) 190/66 (!) 211/69  Pulse: (!) 54 (!) 59 60 66  Resp: (!) 9 (!) 22 18 16   Temp:   97.7 F (36.5 C) 98.2 F (36.8 C)  TempSrc:   Oral Oral  SpO2: 99% 97% 97% 100%  Weight:      Height:        Intake/Output Summary (Last 24 hours) at 10/14/2020 1338 Last data filed at 10/14/2020 0234 Gross per 24 hour  Intake 60 ml  Output 445 ml  Net -385 ml   Filed Weights   10/13/20 2147  Weight: 64.4 kg    Exam:  General: NAD, dizziness, nystagmus noted  Cardiovascular: S1, S2 present  Respiratory: CTAB  Abdomen: Soft,  nontender, nondistended, bowel sounds present  Musculoskeletal: No bilateral pedal edema noted  Skin: Normal  Psychiatry: Normal mood  Neurology: Strength equal in all extremities, sensation intact, no nystagmus noted    Data Reviewed: CBC: Recent Labs  Lab 10/13/20 1637  WBC 7.4  NEUTROABS 4.3  HGB 14.9  HCT 45.7  MCV 93.3  PLT 269   Basic Metabolic Panel: Recent Labs  Lab 10/13/20 1637  NA 138  K 4.3  CL 101  CO2 25  GLUCOSE 85  BUN 26*  CREATININE 1.15  CALCIUM 9.3   GFR: Estimated Creatinine Clearance: 44.7 mL/min (by C-G  formula based on SCr of 1.15 mg/dL). Liver Function Tests: Recent Labs  Lab 10/13/20 1637  AST 44*  ALT 19  ALKPHOS 72  BILITOT 1.1  PROT 7.7  ALBUMIN 3.9   No results for input(s): LIPASE, AMYLASE in the last 168 hours. No results for input(s): AMMONIA in the last 168 hours. Coagulation Profile: Recent Labs  Lab 10/13/20 1637  INR 1.1   Cardiac Enzymes: No results for input(s): CKTOTAL, CKMB, CKMBINDEX, TROPONINI in the last 168 hours. BNP (last 3 results) No results for input(s): PROBNP in the last 8760 hours. HbA1C: Recent Labs    10/14/20 0105  HGBA1C 6.2*   CBG: Recent Labs  Lab 10/13/20 1919  GLUCAP 103*   Lipid Profile: Recent Labs    10/14/20 0105  CHOL 245*  HDL 65  LDLCALC 165*  TRIG 74  CHOLHDL 3.8   Thyroid Function Tests: No results for input(s): TSH, T4TOTAL, FREET4, T3FREE, THYROIDAB in the last 72 hours. Anemia Panel: No results for input(s): VITAMINB12, FOLATE, FERRITIN, TIBC, IRON, RETICCTPCT in the last 72 hours. Urine analysis:    Component Value Date/Time   COLORURINE YELLOW 10/13/2020 1637   APPEARANCEUR CLEAR 10/13/2020 1637   LABSPEC 1.027 10/13/2020 1637   PHURINE 5.5 10/13/2020 1637   GLUCOSEU NEGATIVE 10/13/2020 1637   HGBUR NEGATIVE 10/13/2020 1637   BILIRUBINUR NEGATIVE 10/13/2020 1637   KETONESUR 40 (A) 10/13/2020 1637   PROTEINUR 30 (A) 10/13/2020 1637   NITRITE NEGATIVE 10/13/2020 1637   LEUKOCYTESUR NEGATIVE 10/13/2020 1637   Sepsis Labs: @LABRCNTIP (procalcitonin:4,lacticidven:4)  ) Recent Results (from the past 240 hour(s))  Resp Panel by RT-PCR (Flu A&B, Covid) Nasopharyngeal Swab     Status: None   Collection Time: 10/13/20  4:37 PM   Specimen: Nasopharyngeal Swab; Nasopharyngeal(NP) swabs in vial transport medium  Result Value Ref Range Status   SARS Coronavirus 2 by RT PCR NEGATIVE NEGATIVE Final    Comment: (NOTE) SARS-CoV-2 target nucleic acids are NOT DETECTED.  The SARS-CoV-2 RNA is generally  detectable in upper respiratory specimens during the acute phase of infection. The lowest concentration of SARS-CoV-2 viral copies this assay can detect is 138 copies/mL. A negative result does not preclude SARS-Cov-2 infection and should not be used as the sole basis for treatment or other patient management decisions. A negative result may occur with  improper specimen collection/handling, submission of specimen other than nasopharyngeal swab, presence of viral mutation(s) within the areas targeted by this assay, and inadequate number of viral copies(<138 copies/mL). A negative result must be combined with clinical observations, patient history, and epidemiological information. The expected result is Negative.  Fact Sheet for Patients:  12/13/20  Fact Sheet for Healthcare Providers:  BloggerCourse.com  This test is no t yet approved or cleared by the [place] FDA and  has been authorized for detection and/or diagnosis of SARS-CoV-2 by FDA under an  Emergency Use Authorization (EUA). This EUA will remain  in effect (meaning this test can be used) for the duration of the COVID-19 declaration under Section 564(b)(1) of the Act, 21 U.S.C.section 360bbb-3(b)(1), unless the authorization is terminated  or revoked sooner.       Influenza A by PCR NEGATIVE NEGATIVE Final   Influenza B by PCR NEGATIVE NEGATIVE Final    Comment: (NOTE) The Xpert Xpress SARS-CoV-2/FLU/RSV plus assay is intended as an aid in the diagnosis of influenza from Nasopharyngeal swab specimens and should not be used as a sole basis for treatment. Nasal washings and aspirates are unacceptable for Xpert Xpress SARS-CoV-2/FLU/RSV testing.  Fact Sheet for Patients: BloggerCourse.com  Fact Sheet for Healthcare Providers: [employer]ious[employer]ker.it  This test is not yet approved or cleared by the [place] FDA  and has been authorized for detection and/or diagnosis of SARS-CoV-2 by FDA under an Emergency Use Authorization (EUA). This EUA will remain in effect (meaning this test can be used) for the duration of the COVID-19 declaration under Section 564(b)(1) of the Act, 21 U.S.C. section 360bbb-3(b)(1), unless the authorization is terminated or revoked.  Performed at [facility]       Studies: CT Angio Head W or Wo Contrast  Result Date: 10/13/2020 CLINICAL DATA:  Initial evaluation for neuro deficit, stroke suspected. EXAM: CT ANGIOGRAPHY HEAD AND NECK CT PERFUSION BRAIN TECHNIQUE: Multidetector CT imaging of the head and neck was performed using the standard protocol during bolus administration of intravenous contrast. Multiplanar CT image reconstructions and MIPs were obtained to evaluate the vascular anatomy. Carotid stenosis measurements (when applicable) are obtained utilizing NASCET criteria, using the distal internal carotid diameter as the denominator. Multiphase CT imaging of the brain was performed following IV bolus contrast injection. Subsequent parametric perfusion maps were calculated using RAPID software. CONTRAST:  OMNIPAQUE IOHEXOL 350 MG/ML SOLN COMPARISON:  Prior CT from earlier the same day. FINDINGS: CTA NECK FINDINGS Aortic arch: Visualized aortic arch normal in caliber with normal 3 vessel morphology. Mild atheromatous change about the arch itself. No hemodynamically significant stenosis about the origin of the great vessels. Visualized subclavian arteries patent. Right carotid system: Right CCA patent from its origin to the bifurcation without stenosis. Scattered calcified and noncalcified plaque about the right carotid bulb/proximal right ICA with associated stenosis of up to 60-70% by NASCET criteria. Right ICA patent distally to the skull base without stenosis, dissection or occlusion. Left carotid system: Left CCA patent from its origin to the bifurcation  without stenosis. Scattered mixed plaque about the left carotid bulb/proximal left ICA with associated stenosis of up to 60-70% by NASCET criteria. Left ICA patent distally without stenosis, dissection or occlusion. Vertebral arteries: Both vertebral arteries arise from the subclavian arteries. 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 Scant distal reconstitution at the right V2 segment, with subsequent reocclusion by the skull base. Multifocal severe stenoses noted involving the proximal left V1 segment. Left vertebral artery otherwise patent to the skull base without stenosis or other acute vascular abnormality. Skeleton: No visible acute osseous finding. No discrete or worrisome osseous lesions. Other neck: No other acute soft tissue abnormality within the neck. 1.2 cm left thyroid nodule noted, felt to be of doubtful significance given size and patient age. No follow-up imaging recommended (ref: J Am Coll Radiol. 2015 Feb;12(2): 143-50). Upper chest: 4 mm nodule partially visualized at the superior segment of the left lower lobe (series 5, image 1). Visualized upper chest demonstrates no other acute finding. Review  of the MIP images confirms the above findings CTA HEAD FINDINGS Anterior circulation: Petrous segments patent bilaterally. Scattered atheromatous plaque throughout the carotid siphons with associated moderate to severe multifocal stenoses. A1 segments patent bilaterally. Normal anterior communicating artery complex. Anterior cerebral arteries patent to their distal aspects without stenosis. M1 segments patent bilaterally. Normal MCA bifurcations. Focal moderate to severe proximal left M2 stenosis, inferior division (series 7, image 107). Left MCA branches well perfused distally. No proximal right MCA branch occlusion or stenosis. Diffuse small vessel atheromatous irregularity. Posterior circulation: Left vertebral artery patent as it courses into the cranial vault. Left PICA origin patent and  normal. Focal moderate to severe distal left V4 stenosis (series 7, image 129). Right vertebral artery occluded at the skull base. Right PICA not seen. Basilar diminutive and mildly irregular but patent to its distal aspect without high-grade stenosis. Superior cerebral arteries patent bilaterally. Left PCA primarily supplied via the basilar. Right PCA supplied via the basilar as well as a robust right posterior communicating artery. Both PCAs widely patent to their distal aspects. Venous sinuses: Patent allowing for timing the contrast bolus. Anatomic variants: None significant.  No aneurysm. Review of the MIP images confirms the above findings CT Brain Perfusion Findings: ASPECTS: Not performed. CBF (<30%) Volume: 0mL Perfusion (Tmax>6.0s) volume: 0mL Mismatch Volume: 0mL Infarction Location:Negative CT perfusion with no evidence for acute ischemia or other perfusion deficit. IMPRESSION: 1. Negative CTA for emergent large vessel occlusion. 2. Negative CT perfusion with no evidence for acute ischemia or other perfusion deficit. 3. Atheromatous change about the carotid bifurcations bilaterally with associated stenoses of up to 60-70% bilaterally. 4. Moderate to severe atherosclerotic change throughout the carotid siphons with associated moderate to severe multifocal stenoses. 5. Occlusion of the right vertebral artery at its origin, with multifocal severe proximal left V1 and distal left V4 stenoses. 6. Focal moderate to severe proximal left M2 stenosis, inferior division. 7. 4 mm left lower lobe nodule, indeterminate. Consider follow-up examination with dedicated chest CT for further evaluation. Critical Value/emergent results were called by telephone at the time of interpretation on 10/13/2020 at 8:28 pm to provider [facility] , who verbally acknowledged these results. Electronically Signed   By: Rise MuBenjamin  McClintock M.D.   On: 10/13/2020 20:34   CT Head Wo Contrast  Result Date: 10/13/2020 CLINICAL DATA:   85 year old male with neurologic deficit. EXAM: CT HEAD WITHOUT CONTRAST TECHNIQUE: Contiguous axial images were obtained from the base of the skull through the vertex without intravenous contrast. COMPARISON:  None. FINDINGS: Brain: Mild age-related atrophy and moderate chronic microvascular ischemic changes. There is no acute intracranial hemorrhage. No mass effect midline shift. No extra-axial fluid collection. Vascular: Apparent high attenuating focus in the M2 segment of the right MCA (12/2). This is likely artifactual and related to hemoconcentration/dehydration. Similar slightly higher attenuation of the intracranial vasculature noted. If there is clinical concern for right MCA territory infarct, further evaluation with CT is recommended. Skull: Normal. Negative for fracture or focal lesion. Sinuses/Orbits: Mild mucoperiosteal thickening of paranasal sinuses and a small left maxillary sinus retention cyst or polyp. No air-fluid level. The mastoid air cells are clear. Other: None IMPRESSION: 1. No acute intracranial hemorrhage. 2. Age-related atrophy and chronic microvascular ischemic changes. Electronically Signed   By: Elgie CollardArash  Radparvar M.D.   On: 10/13/2020 16:13   CT Angio Neck W and/or Wo Contrast  Result Date: 10/13/2020 CLINICAL DATA:  Initial evaluation for neuro deficit, stroke suspected. EXAM: CT ANGIOGRAPHY HEAD AND NECK CT PERFUSION  BRAIN TECHNIQUE: Multidetector CT imaging of the head and neck was performed using the standard protocol during bolus administration of intravenous contrast. Multiplanar CT image reconstructions and MIPs were obtained to evaluate the vascular anatomy. Carotid stenosis measurements (when applicable) are obtained utilizing NASCET criteria, using the distal internal carotid diameter as the denominator. Multiphase CT imaging of the brain was performed following IV bolus contrast injection. Subsequent parametric perfusion maps were calculated using RAPID software.  CONTRAST:  OMNIPAQUE IOHEXOL 350 MG/ML SOLN COMPARISON:  Prior CT from earlier the same day. FINDINGS: CTA NECK FINDINGS Aortic arch: Visualized aortic arch normal in caliber with normal 3 vessel morphology. Mild atheromatous change about the arch itself. No hemodynamically significant stenosis about the origin of the great vessels. Visualized subclavian arteries patent. Right carotid system: Right CCA patent from its origin to the bifurcation without stenosis. Scattered calcified and noncalcified plaque about the right carotid bulb/proximal right ICA with associated stenosis of up to 60-70% by NASCET criteria. Right ICA patent distally to the skull base without stenosis, dissection or occlusion. Left carotid system: Left CCA patent from its origin to the bifurcation without stenosis. Scattered mixed plaque about the left carotid bulb/proximal left ICA with associated stenosis of up to 60-70% by NASCET criteria. Left ICA patent distally without stenosis, dissection or occlusion. Vertebral arteries: Both vertebral arteries arise from the subclavian arteries. 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 Scant distal reconstitution at the right V2 segment, with subsequent reocclusion by the skull base. Multifocal severe stenoses noted involving the proximal left V1 segment. Left vertebral artery otherwise patent to the skull base without stenosis or other acute vascular abnormality. Skeleton: No visible acute osseous finding. No discrete or worrisome osseous lesions. Other neck: No other acute soft tissue abnormality within the neck. 1.2 cm left thyroid nodule noted, felt to be of doubtful significance given size and patient age. No follow-up imaging recommended (ref: J Am Coll Radiol. 2015 Feb;12(2): 143-50). Upper chest: 4 mm nodule partially visualized at the superior segment of the left lower lobe (series 5, image 1). Visualized upper chest demonstrates no other acute finding. Review of the MIP images  confirms the above findings CTA HEAD FINDINGS Anterior circulation: Petrous segments patent bilaterally. Scattered atheromatous plaque throughout the carotid siphons with associated moderate to severe multifocal stenoses. A1 segments patent bilaterally. Normal anterior communicating artery complex. Anterior cerebral arteries patent to their distal aspects without stenosis. M1 segments patent bilaterally. Normal MCA bifurcations. Focal moderate to severe proximal left M2 stenosis, inferior division (series 7, image 107). Left MCA branches well perfused distally. No proximal right MCA branch occlusion or stenosis. Diffuse small vessel atheromatous irregularity. Posterior circulation: Left vertebral artery patent as it courses into the cranial vault. Left PICA origin patent and normal. Focal moderate to severe distal left V4 stenosis (series 7, image 129). Right vertebral artery occluded at the skull base. Right PICA not seen. Basilar diminutive and mildly irregular but patent to its distal aspect without high-grade stenosis. Superior cerebral arteries patent bilaterally. Left PCA primarily supplied via the basilar. Right PCA supplied via the basilar as well as a robust right posterior communicating artery. Both PCAs widely patent to their distal aspects. Venous sinuses: Patent allowing for timing the contrast bolus. Anatomic variants: None significant.  No aneurysm. Review of the MIP images confirms the above findings CT Brain Perfusion Findings: ASPECTS: Not performed. CBF (<30%) Volume: 0mL Perfusion (Tmax>6.0s) volume: 0mL Mismatch Volume: 0mL Infarction Location:Negative CT perfusion with no evidence for acute ischemia  or other perfusion deficit. IMPRESSION: 1. Negative CTA for emergent large vessel occlusion. 2. Negative CT perfusion with no evidence for acute ischemia or other perfusion deficit. 3. Atheromatous change about the carotid bifurcations bilaterally with associated stenoses of up to 60-70%  bilaterally. 4. Moderate to severe atherosclerotic change throughout the carotid siphons with associated moderate to severe multifocal stenoses. 5. Occlusion of the right vertebral artery at its origin, with multifocal severe proximal left V1 and distal left V4 stenoses. 6. Focal moderate to severe proximal left M2 stenosis, inferior division. 7. 4 mm left lower lobe nodule, indeterminate. Consider follow-up examination with dedicated chest CT for further evaluation. Critical Value/emergent results were called by telephone at the time of interpretation on 10/13/2020 at 8:28 pm to provider [facility] , who verbally acknowledged these results. Electronically Signed   By: Rise Mu M.D.   On: 10/13/2020 20:34   MR BRAIN WO CONTRAST  Result Date: 10/14/2020 CLINICAL DATA:  Initial evaluation for 4 day history of intermittent dizziness, stroke. EXAM: MRI HEAD WITHOUT CONTRAST TECHNIQUE: Multiplanar, multiecho pulse sequences of the brain and surrounding structures were obtained without intravenous contrast. COMPARISON:  Prior CTs from 10/13/2020 FINDINGS: Brain: Generalized age-related cerebral atrophy. Patchy and confluent T2/FLAIR hyperintensity within the periventricular and deep white matter both cerebral hemispheres most consistent with chronic small vessel ischemic disease, moderate nature. Multiple scattered superimposed remote lacunar infarcts noted within the hemispheric cerebral white matter. Few scattered remote bilateral cerebellar infarcts noted as well. Patchy small volume acute ischemic infarcts seen involving the left greater than right cerebellar hemispheres (series 2, images 16, 13, 10). Largest area of infarction seen on the left and measures 9 mm. No associated hemorrhage or mass effect. No other diffusion abnormality to suggest acute or subacute ischemia. Gray-white matter differentiation otherwise maintained. No other areas of remote cortical infarction. No other foci of  susceptibility artifact to suggest acute or chronic intracranial hemorrhage. No mass lesion, midline shift or mass effect. No hydrocephalus or extra-axial fluid collection. Pituitary gland suprasellar region within normal limits. Midline structures intact. Vascular: Poorly visualized flow void within the right vertebral artery, seen to be occluded on prior CTA. Left V4 segment not well seen on either, also seen to demonstrate a severe stenosis on prior CTA. Major intracranial vascular flow voids are otherwise maintained. Skull and upper cervical spine: Craniocervical junction within normal limits. Heterogeneous signal intensity seen throughout the visualized bone marrow without focal marrow replacing lesion. No scalp soft tissue abnormality. Sinuses/Orbits: Globes and orbital soft tissues within normal limits. Left maxillary sinus retention cyst. Scattered mucosal thickening within the ethmoidal air cells. Paranasal sinuses are otherwise clear. No significant mastoid effusion. Other: None. IMPRESSION: 1. Patchy small volume acute ischemic infarcts involving the left greater than right cerebellar hemispheres. No associated hemorrhage or mass effect. 2. Age-related cerebral atrophy with moderate chronic microvascular ischemic disease, with multiple remote lacunar infarcts involving the hemispheric cerebral white matter and cerebellum. Electronically Signed   By: Rise Mu M.D.   On: 10/14/2020 00:39   CT CEREBRAL PERFUSION W CONTRAST  Result Date: 10/13/2020 CLINICAL DATA:  Initial evaluation for neuro deficit, stroke suspected. EXAM: CT ANGIOGRAPHY HEAD AND NECK CT PERFUSION BRAIN TECHNIQUE: Multidetector CT imaging of the head and neck was performed using the standard protocol during bolus administration of intravenous contrast. Multiplanar CT image reconstructions and MIPs were obtained to evaluate the vascular anatomy. Carotid stenosis measurements (when applicable) are obtained utilizing NASCET  criteria, using the distal internal carotid diameter as  the denominator. Multiphase CT imaging of the brain was performed following IV bolus contrast injection. Subsequent parametric perfusion maps were calculated using RAPID software. CONTRAST:  OMNIPAQUE IOHEXOL 350 MG/ML SOLN COMPARISON:  Prior CT from earlier the same day. FINDINGS: CTA NECK FINDINGS Aortic arch: Visualized aortic arch normal in caliber with normal 3 vessel morphology. Mild atheromatous change about the arch itself. No hemodynamically significant stenosis about the origin of the great vessels. Visualized subclavian arteries patent. Right carotid system: Right CCA patent from its origin to the bifurcation without stenosis. Scattered calcified and noncalcified plaque about the right carotid bulb/proximal right ICA with associated stenosis of up to 60-70% by NASCET criteria. Right ICA patent distally to the skull base without stenosis, dissection or occlusion. Left carotid system: Left CCA patent from its origin to the bifurcation without stenosis. Scattered mixed plaque about the left carotid bulb/proximal left ICA with associated stenosis of up to 60-70% by NASCET criteria. Left ICA patent distally without stenosis, dissection or occlusion. Vertebral arteries: Both vertebral arteries arise from the subclavian arteries. 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 Scant distal reconstitution at the right V2 segment, with subsequent reocclusion by the skull base. Multifocal severe stenoses noted involving the proximal left V1 segment. Left vertebral artery otherwise patent to the skull base without stenosis or other acute vascular abnormality. Skeleton: No visible acute osseous finding. No discrete or worrisome osseous lesions. Other neck: No other acute soft tissue abnormality within the neck. 1.2 cm left thyroid nodule noted, felt to be of doubtful significance given size and patient age. No follow-up imaging recommended (ref: J Am Coll  Radiol. 2015 Feb;12(2): 143-50). Upper chest: 4 mm nodule partially visualized at the superior segment of the left lower lobe (series 5, image 1). Visualized upper chest demonstrates no other acute finding. Review of the MIP images confirms the above findings CTA HEAD FINDINGS Anterior circulation: Petrous segments patent bilaterally. Scattered atheromatous plaque throughout the carotid siphons with associated moderate to severe multifocal stenoses. A1 segments patent bilaterally. Normal anterior communicating artery complex. Anterior cerebral arteries patent to their distal aspects without stenosis. M1 segments patent bilaterally. Normal MCA bifurcations. Focal moderate to severe proximal left M2 stenosis, inferior division (series 7, image 107). Left MCA branches well perfused distally. No proximal right MCA branch occlusion or stenosis. Diffuse small vessel atheromatous irregularity. Posterior circulation: Left vertebral artery patent as it courses into the cranial vault. Left PICA origin patent and normal. Focal moderate to severe distal left V4 stenosis (series 7, image 129). Right vertebral artery occluded at the skull base. Right PICA not seen. Basilar diminutive and mildly irregular but patent to its distal aspect without high-grade stenosis. Superior cerebral arteries patent bilaterally. Left PCA primarily supplied via the basilar. Right PCA supplied via the basilar as well as a robust right posterior communicating artery. Both PCAs widely patent to their distal aspects. Venous sinuses: Patent allowing for timing the contrast bolus. Anatomic variants: None significant.  No aneurysm. Review of the MIP images confirms the above findings CT Brain Perfusion Findings: ASPECTS: Not performed. CBF (<30%) Volume: 0mL Perfusion (Tmax>6.0s) volume: 0mL Mismatch Volume: 0mL Infarction Location:Negative CT perfusion with no evidence for acute ischemia or other perfusion deficit. IMPRESSION: 1. Negative CTA for emergent  large vessel occlusion. 2. Negative CT perfusion with no evidence for acute ischemia or other perfusion deficit. 3. Atheromatous change about the carotid bifurcations bilaterally with associated stenoses of up to 60-70% bilaterally. 4. Moderate to severe atherosclerotic change throughout the carotid  siphons with associated moderate to severe multifocal stenoses. 5. Occlusion of the right vertebral artery at its origin, with multifocal severe proximal left V1 and distal left V4 stenoses. 6. Focal moderate to severe proximal left M2 stenosis, inferior division. 7. 4 mm left lower lobe nodule, indeterminate. Consider follow-up examination with dedicated chest CT for further evaluation. Critical Value/emergent results were called by telephone at the time of interpretation on 10/13/2020 at 8:28 pm to provider [facility] , who verbally acknowledged these results. Electronically Signed   By: Rise Mu M.D.   On: 10/13/2020 20:34    Scheduled Meds: .  stroke: mapping our early stages of recovery book   Does not apply Once  . aspirin  325 mg Oral Daily  . atorvastatin  80 mg Oral Daily  . clopidogrel  75 mg Oral Daily  . enoxaparin (LOVENOX) injection  40 mg Subcutaneous QHS    Continuous Infusions:   LOS: 0 days     Briant Cedar, MD [facility]  If 7PM-7AM, please contact night-coverage [email or web address] 10/14/2020, 1:38 PM

## 2020-10-14 NOTE — Progress Notes (Signed)
PT Cancellation Note  Patient Details Name: TREVELL PARISEAU MRN: [id number] DOB: 1936-06-25   Cancelled Treatment:    Reason Eval/Treat Not Completed: Medical issues which prohibited therapy   BP supine 235/77 - RN notified.   Jerolyn Center, PT Pager [phone]    Zena Amos 10/14/2020, 12:56 PM

## 2020-10-14 NOTE — Progress Notes (Signed)
OT Cancellation Note  Patient Details Name: Charles Porter MRN: [id number] DOB: February 02, 1936   Cancelled Treatment:    Reason Eval/Treat Not Completed: Medical issues which prohibited therapy.  BP supine 235/77 - RN notified.  Eber Jones., OTR/L Acute Rehabilitation Services Pager [phone] Office [phone]   Jeani Hawking M 10/14/2020, 12:27 PM

## 2020-10-14 NOTE — Evaluation (Signed)
Speech Language Pathology Evaluation Patient Details Name: Charles Porter MRN: [id number] DOB: [birth year] Today's Date: 10/14/2020 Time: 6433-2951 SLP Time Calculation (min) (ACUTE ONLY): 31 min  Problem List:  Patient Active Problem List   Diagnosis Date Noted  . Acute CVA (cerebrovascular accident) (HCC) 10/14/2020  . Hypertensive urgency 10/13/2020  . TIA (transient ischemic attack) 10/13/2020   Past Medical History: History reviewed. No pertinent past medical history. Past Surgical History: History reviewed. No pertinent surgical history. HPI:  85 y.o. male with no significant PMH, takes no chronic medications. Doesn't regularly follow with a doctor however. Pt presented to ED with c/o 4 day h/o intermittent dizziness described as feeling off balance and room spinning sensation. Ambulating with use of cane since that time. Due to persistent symptoms pt presented to ED. In the ED pt BP noted to be 249 systolic.  Pt was given 10mg  hydralazine which dropped his BP all the way to 110 systolic and patient then developed acute onset of dysarthria and L facial droop. Pt transferred to [place] for admission. Since that time his BP has rebounded to 227 and his facial droop and dysarthria have resolved. MRI of head Patchy small volume acute ischemic infarcts involving the left greater than right cerebellar hemispheres.   Assessment / Plan / Recommendation Clinical Impression  Pts cognitive linguistic abilities appear grossly intact per informal assessment. Pt recalls dysarthria of speech and left sided weakness, transiently after BP meds administered, however these have since fulled resolved. Pt with good recall of hospital events and fully oriented. 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 He states he "plans to retire after this". Pt lives with spouse and dog, [employer]. He reports fully independent prior to admission and denies acute  changes with cognitive linguistics. Receptive and expressive language skills appeared intact as well as motor speech skills. Oral motor exam unremarkable. No further ST needs identified.    SLP Assessment  SLP Recommendation/Assessment: Patient does not need any further Speech Lanaguage Pathology Services SLP Visit Diagnosis: Cognitive communication deficit (R41.841)    Follow Up Recommendations       Frequency and Duration           SLP Evaluation Cognition  Overall Cognitive Status: Within Functional Limits for tasks assessed Arousal/Alertness: Awake/alert Orientation Level: Oriented X4 Attention: Focused;Sustained Focused Attention: Appears intact Sustained Attention: Appears intact Memory: Appears intact Awareness: Appears intact Problem Solving: Appears intact Executive Function: Organizing;Sequencing Sequencing: Appears intact Safety/Judgment: Appears intact       Comprehension  Auditory Comprehension Overall Auditory Comprehension: Appears within functional limits for tasks assessed    Expression Expression Primary Mode of Expression: Verbal Verbal Expression Overall Verbal Expression: Appears within functional limits for tasks assessed Written Expression Dominant Hand: Right   Oral / Motor  Oral Motor/Sensory Function Overall Oral Motor/Sensory Function: Within functional limits Motor Speech Overall Motor Speech: Appears within functional limits for tasks assessed   GO                    Earnest Mcgillis E Sabria Florido 10/14/2020, 3:52 PM

## 2020-10-15 DIAGNOSIS — I679 Cerebrovascular disease, unspecified: Secondary | ICD-10-CM

## 2020-10-15 DIAGNOSIS — E782 Mixed hyperlipidemia: Secondary | ICD-10-CM

## 2020-10-15 LAB — BASIC METABOLIC PANEL
Anion gap: 12 (ref 5–15)
BUN: 18 mg/dL (ref 8–23)
CO2: 23 mmol/L (ref 22–32)
Calcium: 8.9 mg/dL (ref 8.9–10.3)
Chloride: 102 mmol/L (ref 98–111)
Creatinine, Ser: 1.12 mg/dL (ref 0.61–1.24)
GFR, Estimated: >60 mL/min (ref >60)
Glucose, Bld: 73 mg/dL (ref 70–99)
Potassium: 4 mmol/L (ref 3.5–5.1)
Sodium: 137 mmol/L (ref 135–145)

## 2020-10-15 LAB — CBC WITH DIFFERENTIAL/PLATELET
Abs Immature Granulocytes: 0.01 10*3/uL (ref 0.00–0.07)
Basophils Absolute: 0.1 10*3/uL (ref 0.0–0.1)
Basophils Relative: 1 %
Eosinophils Absolute: 0.2 10*3/uL (ref 0.0–0.5)
Eosinophils Relative: 3 %
HCT: 46 % (ref 39.0–52.0)
Hemoglobin: 15.5 g/dL (ref 13.0–17.0)
Immature Granulocytes: 0 %
Lymphocytes Relative: 34 %
Lymphs Abs: 2.5 10*3/uL (ref 0.7–4.0)
MCH: 31.3 pg (ref 26.0–34.0)
MCHC: 33.7 g/dL (ref 30.0–36.0)
MCV: 92.7 fL (ref 80.0–100.0)
Monocytes Absolute: 0.7 10*3/uL (ref 0.1–1.0)
Monocytes Relative: 10 %
Neutro Abs: 3.7 10*3/uL (ref 1.7–7.7)
Neutrophils Relative %: 52 %
Platelets: 260 10*3/uL (ref 150–400)
RBC: 4.96 MIL/uL (ref 4.22–5.81)
RDW: 13 % (ref 11.5–15.5)
WBC: 7.2 10*3/uL (ref 4.0–10.5)
nRBC: 0 % (ref 0.0–0.2)

## 2020-10-15 MED ORDER — HYDRALAZINE HCL 10 MG PO TABS
10.0000 mg | ORAL_TABLET | Freq: Three times a day (TID) | ORAL | Status: DC | PRN
  Administered 2020-10-15: 10 mg via ORAL
  Filled 2020-10-15: qty 1

## 2020-10-15 NOTE — Plan of Care (Signed)
  Problem: Education: Goal: Knowledge of General Education information will improve Description: Including pain rating scale, medication(s)/side effects and non-pharmacologic comfort measures Outcome: Progressing   Problem: Health Behavior/Discharge Planning: Goal: Ability to manage health-related needs will improve Outcome: Progressing   Problem: Clinical Measurements: Goal: Ability to maintain clinical measurements within normal limits will improve Outcome: Progressing Goal: Will remain free from infection Outcome: Progressing Goal: Diagnostic test results will improve Outcome: Progressing Goal: Respiratory complications will improve Outcome: Progressing Goal: Cardiovascular complication will be avoided Outcome: Progressing   Problem: Activity: Goal: Risk for activity intolerance will decrease Outcome: Progressing   Problem: Nutrition: Goal: Adequate nutrition will be maintained Outcome: Progressing   Problem: Coping: Goal: Level of anxiety will decrease Outcome: Progressing   Problem: Elimination: Goal: Will not experience complications related to bowel motility Outcome: Progressing Goal: Will not experience complications related to urinary retention Outcome: Progressing   Problem: Pain Managment: Goal: General experience of comfort will improve Outcome: Progressing   Problem: Safety: Goal: Ability to remain free from injury will improve Outcome: Progressing   Problem: Skin Integrity: Goal: Risk for impaired skin integrity will decrease Outcome: Progressing   Problem: Education: Goal: Knowledge of disease or condition will improve Outcome: Progressing Goal: Knowledge of secondary prevention will improve Outcome: Progressing   Problem: Coping: Goal: Will verbalize positive feelings about self Outcome: Progressing   Problem: Self-Care: Goal: Ability to participate in self-care as condition permits will improve Outcome: Progressing Goal: Verbalization  of feelings and concerns over difficulty with self-care will improve Outcome: Progressing Goal: Ability to communicate needs accurately will improve Outcome: Progressing   Problem: Nutrition: Goal: Risk of aspiration will decrease Outcome: Progressing   Problem: Ischemic Stroke/TIA Tissue Perfusion: Goal: Complications of ischemic stroke/TIA will be minimized Outcome: Progressing

## 2020-10-15 NOTE — Progress Notes (Signed)
PROGRESS NOTE  Charles Porter:[id number] DOB: 1935-08-23 DOA: 10/13/2020 PCP: [name], MD  HPI/Recap of past 24 hours: HPI from Dr Jonette Pesa is a 85 y.o. male with no significant PMH, takes no chronic medications. Doesn't regularly follow with a doctor however. Pt presents to ED with c/o 4 day h/o intermittent dizziness described as feeling off balance and room spinning sensation. Ambulating with use of cane since that time. Due to persistent symptoms pt presented to ED. In the ED pt BP noted to be 249 systolic.  Pt was given 10mg  hydralazine which dropped his BP all the way to 110 systolic and patient then developed acute onset of dysarthria and L facial droop. Pt transferred to [facility]. Since that time his BP has rebounded to 227 and his facial droop and dysarthria have resolved.  Neurology consulted.  Patient admitted for further management.    Today, patient still complaining of vertigo, but reports mild improvement.  Denies any new complaints.   Assessment/Plan: Principal Problem:   Hypertensive urgency Active Problems:   TIA (transient ischemic attack)   Acute CVA (cerebrovascular accident) (HCC)   Acute ischemic cerebellar infarct Reports dizziness, vertigo, noted nystagmus at presentation MRI showed above, L greater than right cerebellar hemisphere.  Multiple remote lacunar infarcts involving the cerebral white matter, cerebellum Negative CTA for emergent large vessel occlusion LDL 165, A1c 6.2 Echo showed 60 to 65%, with grade 1 diastolic dysfunction Neurology consulted-appreciate recs Cardiology consulted for event monitor placement on 10/15/20- (office will call patient and arrange it) Continue aspirin, Lipitor, Plavix Neuro check, telemetry PT/OT/SLP  Hypertensive crisis Permissive hypertension Goal of SBP 140-180 Monitor closely  Mild troponin elevation Chronic diastolic HF Chest pain-free Flat trend, likely demand ischemia from  hypertensive crisis EKG with no acute ST changes Echo as above  Hyperlipidemia LDL 165 Start Lipitor     Estimated body mass index is 22.37 kg/m as calculated from the following:   Height as of this encounter: 5\' 6"  (1.676 m).   Weight as of this encounter: 62.9 kg.     Code Status: Full  Family Communication: None at bedside  Disposition Plan: Status is: Inpatient   The patient will require care spanning > 2 midnights and should be moved to inpatient because: Inpatient level of care appropriate due to severity of illness  Dispo: The patient is from: Home              Anticipated d/c is to: CIR              Patient currently is medically stable to d/c.   Difficult to place patient No    Consultants:  Neurology  Procedures:  None  Antimicrobials:  None  DVT prophylaxis: Lovenox   Objective: Vitals:   10/15/20 1100 10/15/20 1109 10/15/20 1159 10/15/20 1300  BP: (!) 188/74 (!) 175/77 (!) 175/77 (!) 174/81  Pulse: 62  85 67  Resp: 12 18 19 17   Temp:   98.4 F (36.9 C)   TempSrc:   Oral   SpO2: 99%  99% 98%  Weight:      Height:        Intake/Output Summary (Last 24 hours) at 10/15/2020 1543 Last data filed at 10/14/2020 2000 Gross per 24 hour  Intake 120 ml  Output 200 ml  Net -80 ml   Filed Weights   10/13/20 2147 10/15/20 0500  Weight: 64.4 kg 62.9 kg    Exam:  General:  NAD, nystagmus noted  Cardiovascular: S1, S2 present  Respiratory: CTAB  Abdomen: Soft, nontender, nondistended, bowel sounds present  Musculoskeletal: No bilateral pedal edema noted  Skin: Normal  Psychiatry: Normal mood  Neurology: Strength equal in all extremities, sensation intact, nystagmus noted    Data Reviewed: CBC: Recent Labs  Lab 10/13/20 1637 10/15/20 0353  WBC 7.4 7.2  NEUTROABS 4.3 3.7  HGB 14.9 15.5  HCT 45.7 46.0  MCV 93.3 92.7  PLT 269 260   Basic Metabolic Panel: Recent Labs  Lab 10/13/20 1637 10/15/20 0353  NA 138 137  K 4.3  4.0  CL 101 102  CO2 25 23  GLUCOSE 85 73  BUN 26* 18  CREATININE 1.15 1.12  CALCIUM 9.3 8.9   GFR: Estimated Creatinine Clearance: 45.2 mL/min (by C-G formula based on SCr of 1.12 mg/dL). Liver Function Tests: Recent Labs  Lab 10/13/20 1637  AST 44*  ALT 19  ALKPHOS 72  BILITOT 1.1  PROT 7.7  ALBUMIN 3.9   No results for input(s): LIPASE, AMYLASE in the last 168 hours. No results for input(s): AMMONIA in the last 168 hours. Coagulation Profile: Recent Labs  Lab 10/13/20 1637  INR 1.1   Cardiac Enzymes: No results for input(s): CKTOTAL, CKMB, CKMBINDEX, TROPONINI in the last 168 hours. BNP (last 3 results) No results for input(s): PROBNP in the last 8760 hours. HbA1C: Recent Labs    10/14/20 0105  HGBA1C 6.2*   CBG: Recent Labs  Lab 10/13/20 1919  GLUCAP 103*   Lipid Profile: Recent Labs    10/14/20 0105  CHOL 245*  HDL 65  LDLCALC 165*  TRIG 74  CHOLHDL 3.8   Thyroid Function Tests: No results for input(s): TSH, T4TOTAL, FREET4, T3FREE, THYROIDAB in the last 72 hours. Anemia Panel: No results for input(s): VITAMINB12, FOLATE, FERRITIN, TIBC, IRON, RETICCTPCT in the last 72 hours. Urine analysis:    Component Value Date/Time   COLORURINE YELLOW 10/13/2020 1637   APPEARANCEUR CLEAR 10/13/2020 1637   LABSPEC 1.027 10/13/2020 1637   PHURINE 5.5 10/13/2020 1637   GLUCOSEU NEGATIVE 10/13/2020 1637   HGBUR NEGATIVE 10/13/2020 1637   BILIRUBINUR NEGATIVE 10/13/2020 1637   KETONESUR 40 (A) 10/13/2020 1637   PROTEINUR 30 (A) 10/13/2020 1637   NITRITE NEGATIVE 10/13/2020 1637   LEUKOCYTESUR NEGATIVE 10/13/2020 1637   Sepsis Labs: @LABRCNTIP (procalcitonin:4,lacticidven:4)  ) Recent Results (from the past 240 hour(s))  Resp Panel by RT-PCR (Flu A&B, Covid) Nasopharyngeal Swab     Status: None   Collection Time: 10/13/20  4:37 PM   Specimen: Nasopharyngeal Swab; Nasopharyngeal(NP) swabs in vial transport medium  Result Value Ref Range Status    SARS Coronavirus 2 by RT PCR NEGATIVE NEGATIVE Final    Comment: (NOTE) SARS-CoV-2 target nucleic acids are NOT DETECTED.  The SARS-CoV-2 RNA is generally detectable in upper respiratory specimens during the acute phase of infection. The lowest concentration of SARS-CoV-2 viral copies this assay can detect is 138 copies/mL. A negative result does not preclude SARS-Cov-2 infection and should not be used as the sole basis for treatment or other patient management decisions. A negative result may occur with  improper specimen collection/handling, submission of specimen other than nasopharyngeal swab, presence of viral mutation(s) within the areas targeted by this assay, and inadequate number of viral copies(<138 copies/mL). A negative result must be combined with clinical observations, patient history, and epidemiological information. The expected result is Negative.  Fact Sheet for Patients:  12/13/20  Fact Sheet for Healthcare Providers:  SeriousBroker.it  This test is no t yet approved or cleared by the [place] and  has been authorized for detection and/or diagnosis of SARS-CoV-2 by FDA under an Emergency Use Authorization (EUA). This EUA will remain  in effect (meaning this test can be used) for the duration of the COVID-19 declaration under Section 564(b)(1) of the Act, 21 U.S.C.section 360bbb-3(b)(1), unless the authorization is terminated  or revoked sooner.       Influenza A by PCR NEGATIVE NEGATIVE Final   Influenza B by PCR NEGATIVE NEGATIVE Final    Comment: (NOTE) The Xpert Xpress SARS-CoV-2/FLU/RSV plus assay is intended as an aid in the diagnosis of influenza from Nasopharyngeal swab specimens and should not be used as a sole basis for treatment. Nasal washings and aspirates are unacceptable for Xpert Xpress SARS-CoV-2/FLU/RSV testing.  Fact Sheet for  Patients: BloggerCourse.com  Fact Sheet for Healthcare Providers: SeriousBroker.it  This test is not yet approved or cleared by the [place] FDA and has been authorized for detection and/or diagnosis of SARS-CoV-2 by FDA under an Emergency Use Authorization (EUA). This EUA will remain in effect (meaning this test can be used) for the duration of the COVID-19 declaration under Section 564(b)(1) of the Act, 21 U.S.C. section 360bbb-3(b)(1), unless the authorization is terminated or revoked.  Performed at [facility]       Studies: No results found.  Scheduled Meds: .  stroke: mapping our early stages of recovery book   Does not apply Once  . amLODipine  10 mg Oral Daily  . aspirin  325 mg Oral Daily  . atorvastatin  80 mg Oral Daily  . clopidogrel  75 mg Oral Daily  . enoxaparin (LOVENOX) injection  40 mg Subcutaneous QHS    Continuous Infusions:   LOS: 1 day     Briant Cedar, MD [facility]  If 7PM-7AM, please contact night-coverage [email or web address] 10/15/2020, 3:43 PM

## 2020-10-15 NOTE — Progress Notes (Signed)
Inpatient Rehab Admissions Coordinator Note:   Per PT/OT recommendations, pt was screened for CIR candidacy by Wolfgang Phoenix, Ms, CCC-SLP.  At this time we are recommending an inpatient rehab consult. AC will place consult order per protocol.   Please contact me with questions.    Wolfgang Phoenix, MS, CCC-SLP Admissions Coordinator [phone] 10/15/20 5:22 PM

## 2020-10-15 NOTE — Progress Notes (Signed)
PT Cancellation Note  Patient Details Name: Charles Porter MRN: [id number] DOB: Jun 05, 1936   Cancelled Treatment:    Reason Eval/Treat Not Completed: Medical issues which prohibited therapy  BP 183/91 with parameters for SBP 140-180 (confirmed with Dr Roda Shutters this morning). Neurology has signed off and contacted Dr. Sharolyn Douglas re: ?additional BP meds to allow activity.   Will follow and proceed with activity as appropriate.   Jerolyn Center, PT Pager [phone]  Zena Amos 10/15/2020, 10:47 AM

## 2020-10-15 NOTE — Plan of Care (Signed)

## 2020-10-15 NOTE — Progress Notes (Signed)
Pt arrived to 2w34 from [facility]. Report received from [facility]. Pt A&O x 4, slight slurring of words and small deficit in right eye. NIHSS performed. BP 202/62. Provider notified. Pt oriented to unit and room. Call bell given to pt and bed alarm on.

## 2020-10-15 NOTE — Evaluation (Signed)
Physical Therapy Evaluation Patient Details Name: Charles Porter MRN: [id number] DOB: 07/12/1936 Today's Date: 10/15/2020   History of Present Illness  85 y.o. male with no significant PMH, takes no chronic medications. Presented to ED 10/13/20 with c/o 4 day h/o intermittent dizziness described as feeling off balance and room spinning sensation.  Ambulating with use of cane since that time. 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  Clinical Impression   Pt admitted secondary to problem above with deficits below. PTA patient was completely independent.  Pt currently requires min assist for all mobility due to vertigo which leads to nausea (no vomiting this date). Patient currently only ale to tolerate 30 feet of ambulation at VERY slow pace with max cues for visual targets to help control his symptoms. Will continue to follow acutely to maximize functional mobility independence and safety.       Follow Up Recommendations CIR    Equipment Recommendations  Rolling walker with 5" wheels    Recommendations for Other Services Rehab consult     Precautions / Restrictions Precautions Precautions: Fall      Mobility  Bed Mobility Overal bed mobility: Needs Assistance Bed Mobility: Supine to Sit     Supine to sit: Min guard     General bed mobility comments: Due to vertigo, maintained HOB elevated. Educated pt on finding a fixed target to look at as coming to EOB and maintain once seated. Pt did well with few cues needed to keep focus    Transfers Overall transfer level: Needs assistance Equipment used: 1 person hand held assist Transfers: Sit to/from Stand Sit to Stand: Min assist         General transfer comment: cued to use wide BOS and focus on object as come to stand; minimal sway  noted on initial standing  Ambulation/Gait Ambulation/Gait assistance: Min assist Gait Distance (Feet): 30 Feet Assistive device: 1 person hand held assist Gait Pattern/deviations: Step-through pattern;Decreased stride length;Wide base of support Gait velocity: very slow Gait velocity interpretation: <1.31 ft/sec, indicative of household ambulator General Gait Details: very rigid, short steps. Cues for focusing on specific objects as he moved through the room and turned (turn head, fixate on target, turn feet). By the time seated back on EOB pt reported he felt slightly nauseated and cued pt to close his eyes which he reported he felt better  Stairs            Wheelchair Mobility    Modified Rankin (Stroke Patients Only) Modified Rankin (Stroke Patients Only) Pre-Morbid Rankin Score: No symptoms Modified Rankin: Moderately severe disability     Balance Overall balance assessment: Needs assistance Sitting-balance support: No upper extremity supported;Feet supported Sitting balance-Leahy Scale: Fair     Standing balance support: Single extremity supported Standing balance-Leahy Scale: Poor                               Pertinent Vitals/Pain Pain Assessment: No/denies pain    Home Living Family/patient expects to be discharged to:: Private residence Living Arrangements: Spouse/significant other Available Help at Discharge: Family (wife unsteady and walks with a cane) Type of Home: House                Prior Function Level of Independence: Independent  Hand Dominance   Dominant Hand: Right    Extremity/Trunk Assessment   Upper Extremity Assessment Upper Extremity Assessment: Defer to OT evaluation    Lower Extremity Assessment Lower Extremity Assessment: Overall WFL for tasks assessed    Cervical / Trunk Assessment Cervical / Trunk Assessment: Normal  Communication   Communication: No difficulties  Cognition  Arousal/Alertness: Awake/alert Behavior During Therapy: WFL for tasks assessed/performed Overall Cognitive Status: Within Functional Limits for tasks assessed                                        General Comments General comments (skin integrity, edema, etc.): Wife and daughter present throughout. Wife trying to assist pt (while she is using a cane herself) and had near fall with abrupt sitting on the bed. Discussed she is not ready to help him and needs to be sure she does not fall.    Exercises     Assessment/Plan    PT Assessment Patient needs continued PT services  PT Problem List Decreased activity tolerance;Decreased balance;Decreased mobility;Decreased knowledge of use of DME;Decreased safety awareness;Decreased knowledge of precautions       PT Treatment Interventions DME instruction;Gait training;Stair training;Functional mobility training;Therapeutic activities;Therapeutic exercise;Balance training;Neuromuscular re-education;Patient/family education    PT Goals (Current goals can be found in the Care Plan section)  Acute Rehab PT Goals Patient Stated Goal: to get back to normal PT Goal Formulation: With patient Time For Goal Achievement: 10/29/20 Potential to Achieve Goals: Good    Frequency Min 4X/week   Barriers to discharge        Co-evaluation               AM-PAC PT "6 Clicks" Mobility  Outcome Measure Help needed turning from your back to your side while in a flat bed without using bedrails?: None Help needed moving from lying on your back to sitting on the side of a flat bed without using bedrails?: A Little Help needed moving to and from a bed to a chair (including a wheelchair)?: A Little Help needed standing up from a chair using your arms (e.g., wheelchair or bedside chair)?: A Little Help needed to walk in hospital room?: A Little Help needed climbing 3-5 steps with a railing? : A Little 6 Click Score: 19    End of Session  Equipment Utilized During Treatment: Gait belt Activity Tolerance: Patient tolerated treatment well Patient left: in bed;with call bell/phone within reach;with bed alarm set;with family/visitor present Nurse Communication: Mobility status;Other (comment) (elevated BP after ambulation 213/90) PT Visit Diagnosis: Dizziness and giddiness (R42);Difficulty in walking, not elsewhere classified (R26.2)    Time: 3532-9924 PT Time Calculation (min) (ACUTE ONLY): 36 min   Charges:   PT Evaluation $PT Eval Moderate Complexity: 1 Mod PT Treatments $Neuromuscular Re-education: 8-22 mins         Jerolyn Center, PT Pager [phone]   Zena Amos 10/15/2020, 1:09 PM

## 2020-10-15 NOTE — Evaluation (Signed)
Occupational Therapy Evaluation Patient Details Name: Charles Porter MRN: [id number] DOB: 16-Jul-1935 Today's Date: 10/15/2020    History of Present Illness 85 y.o. male with no significant PMH, takes no chronic medications. Presented to ED 10/13/20 with c/o 4 day h/o intermittent dizziness described as feeling off balance and room spinning sensation.  Ambulating with use of cane since that time. 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   Clinical Impression   Pt presents with decline in function and safety with ADLs and ADL mobility with impaired strength, balance and endurance. Pt limited by elevated BP with activity (at rest 176/80, with activity in room 184/101). PTA pt lived at home with his wife and was Ind with ADLs/selfcare, mobility and was driving. Pt currently requires min A with LB selfcare and mobility + 1 HHA. Pt would benefit from acute OT services to address impairments to maximize level of function and safety    Follow Up Recommendations  CIR    Equipment Recommendations  None recommended by OT    Recommendations for Other Services Rehab consult     Precautions / Restrictions Precautions Precautions: Fall Restrictions Weight Bearing Restrictions: No      Mobility Bed Mobility Overal bed mobility: Needs Assistance Bed Mobility: Supine to Sit;Sit to Supine     Supine to sit: Min guard Sit to supine: Min guard   General bed mobility comments: Due to vertigo, maintained HOB elevated. Educated pt on finding a fixed target to look at as coming to EOB and maintain once seated. Pt did well with few cues needed to keep focus    Transfers Overall transfer level: Needs assistance Equipment used: 1 person hand held assist Transfers: Sit to/from Stand Sit to Stand: Min assist          General transfer comment: cued to use wide BOS and focus on object as come to stand; minimal sway noted on initial standing    Balance Overall balance assessment: Needs assistance Sitting-balance support: No upper extremity supported;Feet supported Sitting balance-Leahy Scale: Fair     Standing balance support: Single extremity supported;During functional activity Standing balance-Leahy Scale: Poor                             ADL either performed or assessed with clinical judgement   ADL Overall ADL's : Needs assistance/impaired Eating/Feeding: Independent;Sitting   Grooming: Wash/dry hands;Wash/dry face;Min guard;Standing   Upper Body Bathing: Set up;Supervision/ safety;Sitting   Lower Body Bathing: Minimal assistance   Upper Body Dressing : Set up;Supervision/safety;Sitting   Lower Body Dressing: Minimal assistance   Toilet Transfer: Ambulation;Minimal assistance;Cueing for safety   Toileting- Clothing Manipulation and Hygiene: Min guard;Sit to/from stand       Functional mobility during ADLs: Min guard       Vision Baseline Vision/History: Wears glasses Wears Glasses: Reading only Patient Visual Report: No change from baseline       Perception     Praxis      Pertinent Vitals/Pain Pain Assessment: No/denies pain     Hand Dominance Right   Extremity/Trunk Assessment Upper Extremity Assessment Upper Extremity Assessment: Overall WFL for tasks assessed   Lower Extremity Assessment Lower Extremity Assessment: Defer to PT evaluation   Cervical / Trunk Assessment Cervical / Trunk Assessment: Normal   Communication Communication  Communication: No difficulties   Cognition Arousal/Alertness: Awake/alert Behavior During Therapy: WFL for tasks assessed/performed Overall Cognitive Status: Within Functional Limits for tasks assessed                                     General Comments  Wife and daughter present  throughout. Wife trying to assist pt (while she is using a cane herself) and had near fall with abrupt sitting on the bed. Discussed she is not ready to help him and needs to be sure she does not fall.    Exercises     Shoulder Instructions      Home Living Family/patient expects to be discharged to:: Private residence Living Arrangements: Spouse/significant other Available Help at Discharge: Family Type of Home: House       Home Layout: One level     Bathroom Shower/Tub: Tub/shower unit;Walk-in shower                Lives With: Spouse    Prior Functioning/Environment Level of Independence: Independent                 OT Problem List: Decreased strength;Impaired balance (sitting and/or standing);Decreased activity tolerance;Decreased knowledge of use of DME or AE      OT Treatment/Interventions: Self-care/ADL training;Patient/family education;Therapeutic activities;DME and/or AE instruction    OT Goals(Current goals can be found in the care plan section) Acute Rehab OT Goals Patient Stated Goal: to get back to normal OT Goal Formulation: With patient Time For Goal Achievement: 10/29/20 Potential to Achieve Goals: Good  OT Frequency: Min 2X/week   Barriers to D/C:            Co-evaluation              AM-PAC OT "6 Clicks" Daily Activity     Outcome Measure Help from another person eating meals?: None Help from another person taking care of personal grooming?: A Little Help from another person toileting, which includes using toliet, bedpan, or urinal?: A Little Help from another person bathing (including washing, rinsing, drying)?: A Little Help from another person to put on and taking off regular upper body clothing?: A Little Help from another person to put on and taking off regular lower body clothing?: A Little 6 Click Score: 19   End of Session Equipment Utilized During Treatment: Gait belt Nurse Communication: Other (comment) (elevated  BP)  Activity Tolerance: Patient limited by fatigue;Other (comment) (BP elevated) Patient left: in bed;with call bell/phone within reach  OT Visit Diagnosis: Unsteadiness on feet (R26.81);Muscle weakness (generalized) (M62.81)                Time: 4650-3546 OT Time Calculation (min): 26 min Charges:  OT General Charges $OT Visit: 1 Visit OT Evaluation $OT Eval Moderate Complexity: 1 Mod OT Treatments $Self Care/Home Management : 8-22 mins    Galen Manila 10/15/2020, 2:57 PM

## 2020-10-16 ENCOUNTER — Other Ambulatory Visit: Payer: Self-pay | Admitting: Cardiology

## 2020-10-16 DIAGNOSIS — I639 Cerebral infarction, unspecified: Secondary | ICD-10-CM

## 2020-10-16 NOTE — Progress Notes (Signed)
PROGRESS NOTE  MONTGOMERY FAVOR XLK:[id number] DOB: 10-27-1935 DOA: 10/13/2020 PCP: [name], MD  HPI/Recap of past 24 hours: HPI from Dr Jonette Pesa is a 85 y.o. male with no significant PMH, takes no chronic medications. Doesn't regularly follow with a doctor however. Pt presents to ED with c/o 4 day h/o intermittent dizziness described as feeling off balance and room spinning sensation. Ambulating with use of cane since that time. Due to persistent symptoms pt presented to ED. In the ED pt BP noted to be 249 systolic.  Pt was given 10mg  hydralazine which dropped his BP all the way to 110 systolic and patient then developed acute onset of dysarthria and L facial droop. Pt transferred to [facility] for admission. Since that time his BP has rebounded to 227 and his facial droop and dysarthria have resolved.  Neurology consulted.  Patient admitted for further management.    Patient still complaining of vertigo, but notes its improvements.   Assessment/Plan: Principal Problem:   Hypertensive urgency Active Problems:   TIA (transient ischemic attack)   Acute CVA (cerebrovascular accident) (HCC)   Acute ischemic cerebellar infarct Reports dizziness, vertigo, noted nystagmus at presentation MRI showed above, L greater than right cerebellar hemisphere.  Multiple remote lacunar infarcts involving the cerebral white matter, cerebellum Negative CTA for emergent large vessel occlusion LDL 165, A1c 6.2 Echo showed 60 to 65%, with grade 1 diastolic dysfunction Neurology consulted-appreciate recs Cardiology consulted for event monitor placement on 10/15/20- (office will call patient and arrange it) Continue aspirin, Lipitor, Plavix Neuro check, telemetry PT/OT/SLP  Hypertensive crisis Permissive hypertension Goal of SBP 140-180 Monitor closely  Mild troponin elevation Chronic diastolic HF Chest pain-free Flat trend, likely demand ischemia from hypertensive crisis EKG with no  acute ST changes Echo as above  Hyperlipidemia LDL 165 Start Lipitor     Estimated body mass index is 22.37 kg/m as calculated from the following:   Height as of this encounter: 5\' 6"  (1.676 m).   Weight as of this encounter: 62.9 kg.     Code Status: Full  Family Communication: None at bedside  Disposition Plan: Status is: Inpatient   The patient will require care spanning > 2 midnights and should be moved to inpatient because: Inpatient level of care appropriate due to severity of illness  Dispo: The patient is from: Home              Anticipated d/c is to: CIR              Patient currently is medically stable to d/c.   Difficult to place patient No    Consultants:  Neurology  Procedures:  None  Antimicrobials:  None  DVT prophylaxis: Lovenox   Objective: Vitals:   10/16/20 0243 10/16/20 0509 10/16/20 0700 10/16/20 1137  BP: 140/71 (!) 146/87 (!) 142/71 138/69  Pulse:   60 66  Resp:  12 13 13   Temp: 98.3 F (36.8 C) 97.9 F (36.6 C) 97.8 F (36.6 C) 98.7 F (37.1 C)  TempSrc: Oral Oral Oral Oral  SpO2:   98% 99%  Weight:      Height:        Intake/Output Summary (Last 24 hours) at 10/16/2020 1624 Last data filed at 10/16/2020 0500 Gross per 24 hour  Intake --  Output 450 ml  Net -450 ml   Filed Weights   10/13/20 2147 10/15/20 0500  Weight: 64.4 kg 62.9 kg    Exam:  General:  NAD  Cardiovascular: S1, S2 present  Respiratory: CTAB  Abdomen: Soft, nontender, nondistended, bowel sounds present  Musculoskeletal: No bilateral pedal edema noted  Skin: Normal  Psychiatry: Normal mood  Neurology: Strength equal in all extremities, sensation intact, nystagmus noted    Data Reviewed: CBC: Recent Labs  Lab 10/13/20 1637 10/15/20 0353  WBC 7.4 7.2  NEUTROABS 4.3 3.7  HGB 14.9 15.5  HCT 45.7 46.0  MCV 93.3 92.7  PLT 269 260   Basic Metabolic Panel: Recent Labs  Lab 10/13/20 1637 10/15/20 0353  NA 138 137  K 4.3 4.0   CL 101 102  CO2 25 23  GLUCOSE 85 73  BUN 26* 18  CREATININE 1.15 1.12  CALCIUM 9.3 8.9   GFR: Estimated Creatinine Clearance: 45.2 mL/min (by C-G formula based on SCr of 1.12 mg/dL). Liver Function Tests: Recent Labs  Lab 10/13/20 1637  AST 44*  ALT 19  ALKPHOS 72  BILITOT 1.1  PROT 7.7  ALBUMIN 3.9   No results for input(s): LIPASE, AMYLASE in the last 168 hours. No results for input(s): AMMONIA in the last 168 hours. Coagulation Profile: Recent Labs  Lab 10/13/20 1637  INR 1.1   Cardiac Enzymes: No results for input(s): CKTOTAL, CKMB, CKMBINDEX, TROPONINI in the last 168 hours. BNP (last 3 results) No results for input(s): PROBNP in the last 8760 hours. HbA1C: Recent Labs    10/14/20 0105  HGBA1C 6.2*   CBG: Recent Labs  Lab 10/13/20 1919  GLUCAP 103*   Lipid Profile: Recent Labs    10/14/20 0105  CHOL 245*  HDL 65  LDLCALC 165*  TRIG 74  CHOLHDL 3.8   Thyroid Function Tests: No results for input(s): TSH, T4TOTAL, FREET4, T3FREE, THYROIDAB in the last 72 hours. Anemia Panel: No results for input(s): VITAMINB12, FOLATE, FERRITIN, TIBC, IRON, RETICCTPCT in the last 72 hours. Urine analysis:    Component Value Date/Time   COLORURINE YELLOW 10/13/2020 1637   APPEARANCEUR CLEAR 10/13/2020 1637   LABSPEC 1.027 10/13/2020 1637   PHURINE 5.5 10/13/2020 1637   GLUCOSEU NEGATIVE 10/13/2020 1637   HGBUR NEGATIVE 10/13/2020 1637   BILIRUBINUR NEGATIVE 10/13/2020 1637   KETONESUR 40 (A) 10/13/2020 1637   PROTEINUR 30 (A) 10/13/2020 1637   NITRITE NEGATIVE 10/13/2020 1637   LEUKOCYTESUR NEGATIVE 10/13/2020 1637   Sepsis Labs: @LABRCNTIP (procalcitonin:4,lacticidven:4)  ) Recent Results (from the past 240 hour(s))  Resp Panel by RT-PCR (Flu A&B, Covid) Nasopharyngeal Swab     Status: None   Collection Time: 10/13/20  4:37 PM   Specimen: Nasopharyngeal Swab; Nasopharyngeal(NP) swabs in vial transport medium  Result Value Ref Range Status   SARS  Coronavirus 2 by RT PCR NEGATIVE NEGATIVE Final    Comment: (NOTE) SARS-CoV-2 target nucleic acids are NOT DETECTED.  The SARS-CoV-2 RNA is generally detectable in upper respiratory specimens during the acute phase of infection. The lowest concentration of SARS-CoV-2 viral copies this assay can detect is 138 copies/mL. A negative result does not preclude SARS-Cov-2 infection and should not be used as the sole basis for treatment or other patient management decisions. A negative result may occur with  improper specimen collection/handling, submission of specimen other than nasopharyngeal swab, presence of viral mutation(s) within the areas targeted by this assay, and inadequate number of viral copies(<138 copies/mL). A negative result must be combined with clinical observations, patient history, and epidemiological information. The expected result is Negative.  Fact Sheet for Patients:  12/13/20  Fact Sheet for Healthcare Providers:  BloggerCourse.com  This test is no t yet approved or cleared by the [place] and  has been authorized for detection and/or diagnosis of SARS-CoV-2 by FDA under an Emergency Use Authorization (EUA). This EUA will remain  in effect (meaning this test can be used) for the duration of the COVID-19 declaration under Section 564(b)(1) of the Act, 21 U.S.C.section 360bbb-3(b)(1), unless the authorization is terminated  or revoked sooner.       Influenza A by PCR NEGATIVE NEGATIVE Final   Influenza B by PCR NEGATIVE NEGATIVE Final    Comment: (NOTE) The Xpert Xpress SARS-CoV-2/FLU/RSV plus assay is intended as an aid in the diagnosis of influenza from Nasopharyngeal swab specimens and should not be used as a sole basis for treatment. Nasal washings and aspirates are unacceptable for Xpert Xpress SARS-CoV-2/FLU/RSV testing.  Fact Sheet for  Patients: BloggerCourse.com  Fact Sheet for Healthcare Providers: SeriousBroker.it  This test is not yet approved or cleared by the [place] FDA and has been authorized for detection and/or diagnosis of SARS-CoV-2 by FDA under an Emergency Use Authorization (EUA). This EUA will remain in effect (meaning this test can be used) for the duration of the COVID-19 declaration under Section 564(b)(1) of the Act, 21 U.S.C. section 360bbb-3(b)(1), unless the authorization is terminated or revoked.  Performed at [facility]       Studies: No results found.  Scheduled Meds: .  stroke: mapping our early stages of recovery book   Does not apply Once  . amLODipine  10 mg Oral Daily  . aspirin  325 mg Oral Daily  . atorvastatin  80 mg Oral Daily  . clopidogrel  75 mg Oral Daily  . enoxaparin (LOVENOX) injection  40 mg Subcutaneous QHS    Continuous Infusions:   LOS: 2 days     Briant Cedar, MD [facility]  If 7PM-7AM, please contact night-coverage [email or web address] 10/16/2020, 4:24 PM

## 2020-10-16 NOTE — Progress Notes (Signed)
Inpatient Rehabilitation Admissions Coordinator  I met at bedside with patient , wife and daughter. We discussed goals and expectations of a possible CIR admit. They all are in agreement. I will begin insurance authorization with [employer] for a possible CIR admit.   Danne Baxter, RN, MSN Rehab Admissions Coordinator [phone] 10/16/2020 1:51 PM

## 2020-10-16 NOTE — Plan of Care (Signed)
  Problem: Education: Goal: Knowledge of General Education information will improve Description: Including pain rating scale, medication(s)/side effects and non-pharmacologic comfort measures Outcome: Progressing   Problem: Health Behavior/Discharge Planning: Goal: Ability to manage health-related needs will improve Outcome: Progressing   Problem: Clinical Measurements: Goal: Ability to maintain clinical measurements within normal limits will improve Outcome: Progressing Goal: Will remain free from infection Outcome: Progressing Goal: Diagnostic test results will improve Outcome: Progressing Goal: Respiratory complications will improve Outcome: Progressing Goal: Cardiovascular complication will be avoided Outcome: Progressing   Problem: Activity: Goal: Risk for activity intolerance will decrease Outcome: Progressing   Problem: Nutrition: Goal: Adequate nutrition will be maintained Outcome: Progressing   Problem: Coping: Goal: Level of anxiety will decrease Outcome: Progressing   Problem: Elimination: Goal: Will not experience complications related to bowel motility Outcome: Progressing Goal: Will not experience complications related to urinary retention Outcome: Progressing   Problem: Pain Managment: Goal: General experience of comfort will improve Outcome: Progressing   Problem: Safety: Goal: Ability to remain free from injury will improve Outcome: Progressing   Problem: Skin Integrity: Goal: Risk for impaired skin integrity will decrease Outcome: Progressing   Problem: Education: Goal: Knowledge of disease or condition will improve Outcome: Progressing Goal: Knowledge of secondary prevention will improve Outcome: Progressing   Problem: Coping: Goal: Will verbalize positive feelings about self Outcome: Progressing   Problem: Self-Care: Goal: Ability to participate in self-care as condition permits will improve Outcome: Progressing Goal: Verbalization  of feelings and concerns over difficulty with self-care will improve Outcome: Progressing Goal: Ability to communicate needs accurately will improve Outcome: Progressing   Problem: Nutrition: Goal: Risk of aspiration will decrease Outcome: Progressing   Problem: Ischemic Stroke/TIA Tissue Perfusion: Goal: Complications of ischemic stroke/TIA will be minimized Outcome: Progressing   

## 2020-10-16 NOTE — Progress Notes (Signed)
Physical Therapy Treatment Patient Details Name: Charles Porter MRN: [id number] DOB: 06/17/1936 Today's Date: 10/16/2020    History of Present Illness 85 y.o. male with no significant PMH, takes no chronic medications. Presented to ED 10/13/20 with c/o 4 day h/o intermittent dizziness described as feeling off balance and room spinning sensation.  Ambulating with use of cane since that time. 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    PT Comments    Patient reports feeling better. Notes he feels less vertigo when lying still, but as mobilizing is similar to yesterday. Was able to ambulate farther, however with increasing left lean as he fatigued. SBP remained under 180 during session.   Supine 159/64 Sitting after walk 174/97 Sitting after rest 143/84     Follow Up Recommendations  CIR     Equipment Recommendations  Rolling walker with 5" wheels    Recommendations for Other Services Rehab consult     Precautions / Restrictions Precautions Precautions: Fall Precaution Comments: vertigo    Mobility  Bed Mobility Overal bed mobility: Needs Assistance Bed Mobility: Rolling;Sidelying to Sit Rolling: Supervision Sidelying to sit: Min assist (with rail)       General bed mobility comments: rolled from right to left with incr vertigo; rested and then side to sit with rail and min assist to steady/balance. cues for visual fixation and appropriate target    Transfers Overall transfer level: Needs assistance Equipment used: Rolling walker (2 wheeled) Transfers: Sit to/from Stand Sit to Stand: Min assist         General transfer comment: cued to use wide BOS and focus on object as come to stand; minimal sway noted on initial standing and left lean  Ambulation/Gait Ambulation/Gait  assistance: Min assist Gait Distance (Feet): 55 Feet (seated rest; 15 ft) Assistive device: Rolling walker (2 wheeled) Gait Pattern/deviations: Step-through pattern;Decreased stride length;Wide base of support Gait velocity: very slow   General Gait Details: very rigid, short steps. Cues for focusing on specific objects as he moved and turned (turn head, fixate on target, turn feet). Incresing left lean as he fatigued.   Stairs             Wheelchair Mobility    Modified Rankin (Stroke Patients Only) Modified Rankin (Stroke Patients Only) Pre-Morbid Rankin Score: No symptoms Modified Rankin: Moderately severe disability     Balance Overall balance assessment: Needs assistance Sitting-balance support: No upper extremity supported;Feet supported Sitting balance-Leahy Scale: Fair     Standing balance support: During functional activity;Bilateral upper extremity supported Standing balance-Leahy Scale: Poor                              Cognition Arousal/Alertness: Awake/alert Behavior During Therapy: WFL for tasks assessed/performed Overall Cognitive Status: Within Functional Limits for tasks assessed                                        Exercises      General Comments        Pertinent Vitals/Pain      Home Living                      Prior Function  PT Goals (current goals can now be found in the care plan section) Acute Rehab PT Goals Patient Stated Goal: to get back to normal Time For Goal Achievement: 10/29/20 Potential to Achieve Goals: Good Progress towards PT goals: Progressing toward goals    Frequency    Min 4X/week      PT Plan Current plan remains appropriate    Co-evaluation              AM-PAC PT "6 Clicks" Mobility   Outcome Measure  Help needed turning from your back to your side while in a flat bed without using bedrails?: None Help needed moving from lying on your back  to sitting on the side of a flat bed without using bedrails?: A Little Help needed moving to and from a bed to a chair (including a wheelchair)?: A Little Help needed standing up from a chair using your arms (e.g., wheelchair or bedside chair)?: A Little Help needed to walk in hospital room?: A Little Help needed climbing 3-5 steps with a railing? : A Little 6 Click Score: 19    End of Session Equipment Utilized During Treatment: Gait belt Activity Tolerance: Patient tolerated treatment well Patient left: with call bell/phone within reach;in chair;with chair alarm set Nurse Communication: Mobility status;Other (comment) (use of visual targets to decr vertigo) PT Visit Diagnosis: Dizziness and giddiness (R42);Difficulty in walking, not elsewhere classified (R26.2)     Time: 1610-9604 PT Time Calculation (min) (ACUTE ONLY): 37 min  Charges:  $Gait Training: 23-37 mins                      Charles Porter, PT Pager [phone]    Charles Porter 10/16/2020, 10:58 AM

## 2020-10-16 NOTE — Consult Note (Signed)
Physical Medicine and Rehabilitation Consult Reason for Consult: Left facial droop with dysarthria Referring Physician: Dr.Ezenduka   HPI: Charles Porter is a 85 y.o. right-handed male with unremarkable past medical history except tobacco use.  She does not regularly follow-up with her PCP.  Per chart review lives with spouse.  Independent prior to admission.  1 level home.  Wife is limited physically to provide assistance.  Presented to [facility] the ED 10/13/2020 with left facial droop and dysarthria as well as dizziness x4 days.  BP noted 249 systolic.  Cranial CT scan negative.  Patient did not receive TPA.  CTA of head and neck negative for emergent large vessel occlusion but did show severe b/l VA stenosis/occlusion.  There was an incidental finding of 4 mm left lower lobe nodule indeterminate recommendations outpatient follow-up.  MRI of the brain showed patchy small volume acute ischemic infarct involving the left greater than right cerebellar hemispheres.  No associated hemorrhage or mass-effect.  Admission chemistries unremarkable except BUN 26, troponin 67-73, urinalysis negative nitrite, hemoglobin A1c 6.2.  Echocardiogram with ejection fraction of 60 to 65% grade 1 diastolic dysfunction.  Follow-up cardiology services for mild elevation in troponin recommendations for event monitor placement 10/15/2020.  Neurology follow-up currently maintained on aspirin 325 mg daily and Plavix 75 mg daily for 3 months then aspirin alone.  Subcutaneous Lovenox for DVT prophylaxis therapy evaluations completed due to patient decreased functional mobility recommendations of physical medicine rehab consult.   Review of Systems  Constitutional: Negative for chills and fever.  HENT: Negative for hearing loss.   Eyes: Negative for blurred vision and double vision.  Respiratory: Negative for cough and shortness of breath.   Cardiovascular: Negative for chest pain, palpitations and leg swelling.   Gastrointestinal: Positive for constipation. Negative for heartburn, nausea and vomiting.  Genitourinary: Negative for dysuria, flank pain and hematuria.  Musculoskeletal: Positive for joint pain and myalgias.  Skin: Negative for rash.  Neurological: Positive for dizziness, speech change, weakness and headaches.  All other systems reviewed and are negative.  History reviewed. No pertinent past medical history. History reviewed. No pertinent surgical history. Family History  Problem Relation Age of Onset  . Hypertension Neg Hx    Social History:  reports that he has quit smoking. His smoking use included cigarettes. He has never used smokeless tobacco. He reports that he does not drink alcohol and does not use drugs. Allergies: No Known Allergies Medications Prior to Admission  Medication Sig Dispense Refill  . Aspirin-Salicylamide-Caffeine (BC HEADACHE POWDER PO) Take 1 packet by mouth daily as needed (pain).      Home: Home Living Family/patient expects to be discharged to:: Private residence Living Arrangements: Spouse/significant other Available Help at Discharge: Family Type of Home: House Home Layout: One level Bathroom Shower/Tub: Tub/shower unit,Walk-in shower  Lives With: Spouse  Functional History: Prior Function Level of Independence: Independent Functional Status:  Mobility: Bed Mobility Overal bed mobility: Needs Assistance Bed Mobility: Supine to Sit,Sit to Supine Supine to sit: Min guard Sit to supine: Min guard General bed mobility comments: Due to vertigo, maintained HOB elevated. Educated pt on finding a fixed target to look at as coming to EOB and maintain once seated. Pt did well with few cues needed to keep focus Transfers Overall transfer level: Needs assistance Equipment used: 1 person hand held assist Transfers: Sit to/from Stand Sit to Stand: Min assist General transfer comment: cued to use wide BOS and focus on object as come  to stand; minimal  sway noted on initial standing Ambulation/Gait Ambulation/Gait assistance: Min assist Gait Distance (Feet): 30 Feet Assistive device: 1 person hand held assist Gait Pattern/deviations: Step-through pattern,Decreased stride length,Wide base of support General Gait Details: very rigid, short steps. Cues for focusing on specific objects as he moved through the room and turned (turn head, fixate on target, turn feet). By the time seated back on EOB pt reported he felt slightly nauseated and cued pt to close his eyes which he reported he felt better Gait velocity: very slow Gait velocity interpretation: <1.31 ft/sec, indicative of household ambulator    ADL: ADL Overall ADL's : Needs assistance/impaired Eating/Feeding: Independent,Sitting Grooming: Wash/dry hands,Wash/dry face,Min guard,Standing Upper Body Bathing: Set up,Supervision/ safety,Sitting Lower Body Bathing: Minimal assistance Upper Body Dressing : Set up,Supervision/safety,Sitting Lower Body Dressing: Minimal assistance Toilet Transfer: Ambulation,Minimal assistance,Cueing for safety Toileting- Clothing Manipulation and Hygiene: Min guard,Sit to/from stand Functional mobility during ADLs: Min guard  Cognition: Cognition Overall Cognitive Status: Within Functional Limits for tasks assessed Arousal/Alertness: Awake/alert Orientation Level: Oriented X4 Attention: Focused,Sustained Focused Attention: Appears intact Sustained Attention: Appears intact Memory: Appears intact Awareness: Appears intact Problem Solving: Appears intact Executive Function: Organizing,Sequencing Sequencing: Appears intact Safety/Judgment: Appears intact Cognition Arousal/Alertness: Awake/alert Behavior During Therapy: WFL for tasks assessed/performed Overall Cognitive Status: Within Functional Limits for tasks assessed  Blood pressure (!) 146/87, pulse 77, temperature 97.9 F (36.6 C), temperature source Oral, resp. rate 12, height 5\' 6"  (1.676  m), weight 62.9 kg, SpO2 98 %. Physical Exam Constitutional:      Appearance: Normal appearance.  HENT:     Head: Normocephalic.     Nose: Nose normal.     Mouth/Throat:     Mouth: Mucous membranes are moist.  Eyes:     Conjunctiva/sclera: Conjunctivae normal.     Pupils: Pupils are equal, round, and reactive to light.  Cardiovascular:     Rate and Rhythm: Normal rate.     Pulses: Normal pulses.  Pulmonary:     Effort: Pulmonary effort is normal.  Abdominal:     Palpations: Abdomen is soft.  Musculoskeletal:        General: Normal range of motion.     Cervical back: Normal range of motion.  Skin:    General: Skin is warm.  Neurological:     Mental Status: He is alert.     Comments: Patient is alert in no acute distress.  Makes eye contact with examiner and follows commands.  Oriented x3. Speech clear, normal language, reasonable insight and awareness, functional memory. 3-4 beats of nystagmus with rightward gaze, 1-2 beats to the left. No limb ataxia, mild dysmetria. Strength 4/5 UE and 4/5 LE. Senses pain and light touch in all 4's.   Psychiatric:        Mood and Affect: Mood normal.        Behavior: Behavior normal.     No results found for this or any previous visit (from the past 24 hour(s)). ECHOCARDIOGRAM COMPLETE  Result Date: 10/14/2020    ECHOCARDIOGRAM REPORT   Patient Name:   Charles Porter [facility] Date of Exam: 10/14/2020 Medical Rec #:  12/14/2020     Height:       66.0 in Accession #:    [id number]    Weight:       142.0 lb Date of Birth:  May 14, 1936    BSA:          1.729 m Patient Age:    89 years  BP:           221/66 mmHg Patient Gender: M             HR:           60 bpm. Exam Location:  Inpatient Procedure: 2D Echo Indications:    Stroke I63.9  History:        Patient has no prior history of Echocardiogram examinations.                 Stroke; Risk Factors:Former Smoker and Hypertension. No                 significant PMH.  Sonographer:    Jeryl Columbia Referring  Phys: [phone] JARED M GARDNER IMPRESSIONS  1. Left ventricular ejection fraction, by estimation, is 60 to 65%. The left ventricle has normal function. The left ventricle has no regional wall motion abnormalities. There is severe concentric left ventricular hypertrophy. Left ventricular diastolic  parameters are consistent with Grade I diastolic dysfunction (impaired relaxation).  2. Right ventricular systolic function is normal. The right ventricular size is normal.  3. The mitral valve is normal in structure. Trivial mitral valve regurgitation.  4. The aortic valve is tricuspid. There is mild thickening of the aortic valve. Aortic valve regurgitation is not visualized. No aortic stenosis is present.  5. The inferior vena cava is normal in size with greater than 50% respiratory variability, suggesting right atrial pressure of 3 mmHg. Comparison(s): No prior Echocardiogram. FINDINGS  Left Ventricle: Left ventricular ejection fraction, by estimation, is 60 to 65%. The left ventricle has normal function. The left ventricle has no regional wall motion abnormalities. The left ventricular internal cavity size was small. There is severe concentric left ventricular hypertrophy. Left ventricular diastolic parameters are consistent with Grade I diastolic dysfunction (impaired relaxation). Right Ventricle: The right ventricular size is normal. No increase in right ventricular wall thickness. Right ventricular systolic function is normal. Left Atrium: Left atrial size was normal in size. Right Atrium: Right atrial size was normal in size. Pericardium: There is no evidence of pericardial effusion. Mitral Valve: The mitral valve is normal in structure. Trivial mitral valve regurgitation. Tricuspid Valve: The tricuspid valve is normal in structure. Tricuspid valve regurgitation is not demonstrated. No evidence of tricuspid stenosis. Aortic Valve: The aortic valve is tricuspid. There is mild thickening of the aortic valve. There is  mild aortic valve annular calcification. Aortic valve regurgitation is not visualized. No aortic stenosis is present. Pulmonic Valve: The pulmonic valve was grossly normal. Pulmonic valve regurgitation is not visualized. No evidence of pulmonic stenosis. Aorta: The aortic root is normal in size and structure. Venous: The inferior vena cava is normal in size with greater than 50% respiratory variability, suggesting right atrial pressure of 3 mmHg. IAS/Shunts: The atrial septum is grossly normal.  LEFT VENTRICLE PLAX 2D LVIDd:         3.25 cm  Diastology LVIDs:         1.50 cm  LV e' medial:    5.66 cm/s LV PW:         1.50 cm  LV E/e' medial:  8.9 LV IVS:        1.35 cm  LV e' lateral:   7.18 cm/s LVOT diam:     1.90 cm  LV E/e' lateral: 7.0 LVOT Area:     2.84 cm  RIGHT VENTRICLE RV S prime:     23.30 cm/s TAPSE (M-mode): 2.9 cm LEFT ATRIUM  Index       RIGHT ATRIUM           Index LA diam:        3.20 cm 1.85 cm/m  RA Area:     11.30 cm LA Vol (A2C):   52.1 ml 30.14 ml/m RA Volume:   23.00 ml  13.30 ml/m LA Vol (A4C):   43.7 ml 25.28 ml/m LA Biplane Vol: 48.4 ml 28.00 ml/m   AORTA Ao Root diam: 3.20 cm MITRAL VALVE MV Area (PHT): 1.84 cm    SHUNTS MV Decel Time: 412 msec    Systemic Diam: 1.90 cm MV E velocity: 50.10 cm/s MV A velocity: 93.00 cm/s MV E/A ratio:  0.54 Riley LamMahesh Chandrasekhar MD Electronically signed by Riley LamMahesh Chandrasekhar MD Signature Date/Time: 10/14/2020/2:38:51 PM    Final      Assessment/Plan: Diagnosis: bilateral cerebellar infarcts 1. Does the need for close, 24 hr/day medical supervision in concert with the patient's rehab needs make it unreasonable for this patient to be served in a less intensive setting? Yes 2. Co-Morbidities requiring supervision/potential complications: HTN 3. Due to bladder management, bowel management, safety, skin/wound care, disease management, medication administration, pain management and patient education, does the patient require 24 hr/day  rehab nursing? Yes 4. Does the patient require coordinated care of a physician, rehab nurse, therapy disciplines of PT, OT to address physical and functional deficits in the context of the above medical diagnosis(es)? Yes Addressing deficits in the following areas: balance, endurance, locomotion, strength, transferring, bowel/bladder control, bathing, dressing, feeding, grooming, toileting and psychosocial support 5. Can the patient actively participate in an intensive therapy program of at least 3 hrs of therapy per day at least 5 days per week? Yes 6. The potential for patient to make measurable gains while on inpatient rehab is excellent 7. Anticipated functional outcomes upon discharge from inpatient rehab are modified independent and supervision  with PT, modified independent and supervision with OT, n/a with SLP. 8. Estimated rehab length of stay to reach the above functional goals is: 8-12 days 9. Anticipated discharge destination: Home 10. Overall Rehab/Functional Prognosis: excellent  RECOMMENDATIONS: This patient's condition is appropriate for continued rehabilitative care in the following setting: CIR Patient has agreed to participate in recommended program. Yes Note that insurance prior authorization may be required for reimbursement for recommended care.  Comment: Rehab Admissions Coordinator to follow up.  Thanks,  Ranelle OysterZachary T. Deasia Chiu, MD, [place] DomFAAPMR  I have personally performed a face to face diagnostic evaluation of this patient. Additionally, I have examined pertinent labs and radiographic images. I have reviewed and concur with the physician assistant's documentation above.    Mcarthur RossettiDaniel J Angiulli, PA-C 10/16/2020

## 2020-10-16 NOTE — Progress Notes (Signed)
   Cardiology asked to place 30day cardiac monitor in the setting of CVA. No prior cardiologist in the chart, therefore will be assigned to DOD, Dr. Shari Prows.

## 2020-10-17 ENCOUNTER — Other Ambulatory Visit: Payer: Self-pay

## 2020-10-17 ENCOUNTER — Encounter (HOSPITAL_COMMUNITY): Payer: Self-pay | Admitting: Physical Medicine & Rehabilitation

## 2020-10-17 ENCOUNTER — Inpatient Hospital Stay (HOSPITAL_COMMUNITY)
Admission: RE | Admit: 2020-10-17 | Discharge: 2020-10-26 | DRG: 057 | Disposition: A | Discharge status: Home / Self Care | Payer: BC Managed Care – PPO | Source: Intra-hospital | Attending: Physical Medicine & Rehabilitation | Admitting: Physical Medicine & Rehabilitation

## 2020-10-17 DIAGNOSIS — Z87891 Personal history of nicotine dependence: Secondary | ICD-10-CM | POA: Diagnosis not present

## 2020-10-17 DIAGNOSIS — R911 Solitary pulmonary nodule: Secondary | ICD-10-CM

## 2020-10-17 DIAGNOSIS — R42 Dizziness and giddiness: Secondary | ICD-10-CM | POA: Diagnosis present

## 2020-10-17 DIAGNOSIS — R7401 Elevation of levels of liver transaminase levels: Secondary | ICD-10-CM

## 2020-10-17 DIAGNOSIS — K5901 Slow transit constipation: Secondary | ICD-10-CM | POA: Diagnosis present

## 2020-10-17 DIAGNOSIS — I639 Cerebral infarction, unspecified: Secondary | ICD-10-CM | POA: Diagnosis present

## 2020-10-17 DIAGNOSIS — E785 Hyperlipidemia, unspecified: Secondary | ICD-10-CM | POA: Diagnosis present

## 2020-10-17 DIAGNOSIS — I69392 Facial weakness following cerebral infarction: Secondary | ICD-10-CM | POA: Diagnosis not present

## 2020-10-17 DIAGNOSIS — R29810 Facial weakness: Secondary | ICD-10-CM | POA: Diagnosis present

## 2020-10-17 DIAGNOSIS — R7303 Prediabetes: Secondary | ICD-10-CM | POA: Diagnosis present

## 2020-10-17 DIAGNOSIS — Z79899 Other long term (current) drug therapy: Secondary | ICD-10-CM

## 2020-10-17 DIAGNOSIS — I10 Essential (primary) hypertension: Secondary | ICD-10-CM

## 2020-10-17 DIAGNOSIS — R001 Bradycardia, unspecified: Secondary | ICD-10-CM | POA: Diagnosis not present

## 2020-10-17 DIAGNOSIS — E041 Nontoxic single thyroid nodule: Secondary | ICD-10-CM

## 2020-10-17 DIAGNOSIS — I160 Hypertensive urgency: Secondary | ICD-10-CM

## 2020-10-17 LAB — CBC
HCT: 45.1 % (ref 39.0–52.0)
Hemoglobin: 15.1 g/dL (ref 13.0–17.0)
MCH: 31.1 pg (ref 26.0–34.0)
MCHC: 33.5 g/dL (ref 30.0–36.0)
MCV: 92.8 fL (ref 80.0–100.0)
Platelets: 293 10*3/uL (ref 150–400)
RBC: 4.86 MIL/uL (ref 4.22–5.81)
RDW: 13 % (ref 11.5–15.5)
WBC: 7 10*3/uL (ref 4.0–10.5)
nRBC: 0 % (ref 0.0–0.2)

## 2020-10-17 LAB — BASIC METABOLIC PANEL
Anion gap: 6 (ref 5–15)
BUN: 22 mg/dL (ref 8–23)
CO2: 27 mmol/L (ref 22–32)
Calcium: 9.1 mg/dL (ref 8.9–10.3)
Chloride: 105 mmol/L (ref 98–111)
Creatinine, Ser: 1.16 mg/dL (ref 0.61–1.24)
GFR, Estimated: >60 mL/min (ref >60)
Glucose, Bld: 96 mg/dL (ref 70–99)
Potassium: 3.8 mmol/L (ref 3.5–5.1)
Sodium: 138 mmol/L (ref 135–145)

## 2020-10-17 MED ORDER — DOCUSATE SODIUM 100 MG PO CAPS
100.0000 mg | ORAL_CAPSULE | Freq: Two times a day (BID) | ORAL | Status: DC
  Administered 2020-10-17: 100 mg via ORAL
  Filled 2020-10-17: qty 1

## 2020-10-17 MED ORDER — ASPIRIN 325 MG PO TABS: 325.0000 mg | ORAL_TABLET | Freq: Every day | ORAL | 3 refills | Status: DC

## 2020-10-17 MED ORDER — ALUM & MAG HYDROXIDE-SIMETH 200-200-20 MG/5ML PO SUSP: 30.0000 mL | ORAL | Status: DC | PRN

## 2020-10-17 MED ORDER — DIPHENHYDRAMINE HCL 12.5 MG/5ML PO ELIX: 12.5000 mg | ORAL_SOLUTION | Freq: Four times a day (QID) | ORAL | Status: DC | PRN

## 2020-10-17 MED ORDER — PROCHLORPERAZINE MALEATE 5 MG PO TABS
5.0000 mg | ORAL_TABLET | Freq: Four times a day (QID) | ORAL | Status: DC | PRN
  Administered 2020-10-19: 10 mg via ORAL
  Filled 2020-10-17: qty 2

## 2020-10-17 MED ORDER — TRAZODONE HCL 50 MG PO TABS
25.0000 mg | ORAL_TABLET | Freq: Every evening | ORAL | Status: DC | PRN
  Administered 2020-10-23: 50 mg via ORAL
  Filled 2020-10-17: qty 1

## 2020-10-17 MED ORDER — POLYETHYLENE GLYCOL 3350 17 G PO PACK
17.0000 g | PACK | Freq: Every day | ORAL | Status: DC
  Administered 2020-10-17 – 2020-10-26 (×10): 17 g via ORAL
  Filled 2020-10-17 (×9): qty 1

## 2020-10-17 MED ORDER — GUAIFENESIN-DM 100-10 MG/5ML PO SYRP: 5.0000 mL | ORAL_SOLUTION | Freq: Four times a day (QID) | ORAL | Status: DC | PRN

## 2020-10-17 MED ORDER — PROCHLORPERAZINE EDISYLATE 10 MG/2ML IJ SOLN: 5.0000 mg | Freq: Four times a day (QID) | INTRAMUSCULAR | Status: DC | PRN

## 2020-10-17 MED ORDER — SCOPOLAMINE 1 MG/3DAYS TD PT72
1.0000 | MEDICATED_PATCH | TRANSDERMAL | Status: DC
  Administered 2020-10-17 – 2020-10-23 (×3): 1.5 mg via TRANSDERMAL
  Filled 2020-10-17 (×3): qty 1

## 2020-10-17 MED ORDER — AMLODIPINE BESYLATE 10 MG PO TABS: 10.0000 mg | ORAL_TABLET | Freq: Every day | ORAL | 3 refills | Status: DC

## 2020-10-17 MED ORDER — ENOXAPARIN SODIUM 40 MG/0.4ML ~~LOC~~ SOLN: 40.0000 mg | SUBCUTANEOUS | Status: DC

## 2020-10-17 MED ORDER — ASPIRIN 325 MG PO TABS
325.0000 mg | ORAL_TABLET | Freq: Every day | ORAL | Status: DC
  Administered 2020-10-18 – 2020-10-26 (×9): 325 mg via ORAL
  Filled 2020-10-17 (×9): qty 1

## 2020-10-17 MED ORDER — CLOPIDOGREL BISULFATE 75 MG PO TABS: 75.0000 mg | ORAL_TABLET | Freq: Every day | ORAL | 3 refills | Status: DC

## 2020-10-17 MED ORDER — EXERCISE FOR HEART AND HEALTH BOOK
Freq: Once | Status: AC
  Filled 2020-10-17: qty 1

## 2020-10-17 MED ORDER — DOCUSATE SODIUM 100 MG PO CAPS: 100.0000 mg | ORAL_CAPSULE | Freq: Two times a day (BID) | ORAL | 0 refills | Status: AC

## 2020-10-17 MED ORDER — BLOOD PRESSURE CONTROL BOOK
Freq: Once | Status: AC
  Filled 2020-10-17: qty 1

## 2020-10-17 MED ORDER — PROCHLORPERAZINE 25 MG RE SUPP
12.5000 mg | Freq: Four times a day (QID) | RECTAL | Status: DC | PRN
Start: 2020-10-17 — End: 2020-10-26

## 2020-10-17 MED ORDER — ATORVASTATIN CALCIUM 80 MG PO TABS
80.0000 mg | ORAL_TABLET | Freq: Every day | ORAL | Status: DC
  Administered 2020-10-18 – 2020-10-26 (×9): 80 mg via ORAL
  Filled 2020-10-17 (×9): qty 1

## 2020-10-17 MED ORDER — CLOPIDOGREL BISULFATE 75 MG PO TABS
75.0000 mg | ORAL_TABLET | Freq: Every day | ORAL | Status: DC
  Administered 2020-10-18 – 2020-10-26 (×9): 75 mg via ORAL
  Filled 2020-10-17 (×9): qty 1

## 2020-10-17 MED ORDER — ATORVASTATIN CALCIUM 80 MG PO TABS: 80.0000 mg | ORAL_TABLET | Freq: Every day | ORAL | 3 refills | Status: DC

## 2020-10-17 MED ORDER — BISACODYL 10 MG RE SUPP
10.0000 mg | Freq: Every day | RECTAL | Status: DC | PRN
Start: 2020-10-17 — End: 2020-10-26
  Administered 2020-10-18 – 2020-10-23 (×2): 10 mg via RECTAL
  Filled 2020-10-17 (×2): qty 1

## 2020-10-17 MED ORDER — POLYETHYLENE GLYCOL 3350 17 G PO PACK
17.0000 g | PACK | Freq: Every day | ORAL | Status: DC | PRN
  Filled 2020-10-17: qty 1

## 2020-10-17 MED ORDER — FLEET ENEMA 7-19 GM/118ML RE ENEM: 1.0000 | ENEMA | Freq: Once | RECTAL | Status: DC | PRN

## 2020-10-17 MED ORDER — AMLODIPINE BESYLATE 10 MG PO TABS
10.0000 mg | ORAL_TABLET | Freq: Every day | ORAL | Status: DC
  Administered 2020-10-18 – 2020-10-26 (×9): 10 mg via ORAL
  Filled 2020-10-17 (×9): qty 1

## 2020-10-17 MED ORDER — DOCUSATE SODIUM 100 MG PO CAPS
100.0000 mg | ORAL_CAPSULE | Freq: Two times a day (BID) | ORAL | Status: DC
  Administered 2020-10-17 – 2020-10-26 (×18): 100 mg via ORAL
  Filled 2020-10-17 (×18): qty 1

## 2020-10-17 MED ORDER — HYDRALAZINE HCL 10 MG PO TABS: 10.0000 mg | ORAL_TABLET | Freq: Three times a day (TID) | ORAL | Status: DC | PRN

## 2020-10-17 MED ORDER — ACETAMINOPHEN 325 MG PO TABS: 325.0000 mg | ORAL_TABLET | ORAL | Status: DC | PRN

## 2020-10-17 MED ORDER — ENOXAPARIN SODIUM 40 MG/0.4ML ~~LOC~~ SOLN
40.0000 mg | Freq: Every day | SUBCUTANEOUS | Status: DC
  Administered 2020-10-17 – 2020-10-23 (×7): 40 mg via SUBCUTANEOUS
  Filled 2020-10-17 (×7): qty 0.4

## 2020-10-17 NOTE — Progress Notes (Signed)
PT Cancellation Note  Patient Details Name: Charles Porter MRN: [id number] DOB: February 22, 1936   Cancelled Treatment:    Reason Eval/Treat Not Completed: Medical issues which prohibited therapy  Patient remains constipated and took his pills on an empty stomach. He reports he feels very nauseated and is afraid he will vomit if he tries to do therapy at this time.   Was able to discuss his home set-up and unfortunately he has a two-level home with all bedrooms and full bathrooms on the second floor. Continue to feel he will need CIR.    Charles Porter, PT Pager [phone]   Zena Amos 10/17/2020, 11:12 AM

## 2020-10-17 NOTE — Progress Notes (Signed)
Patient arrived on unit. Had episode of emesis immediately but felt better after got still. Resting in bed in no acute distress.

## 2020-10-17 NOTE — Discharge Summary (Signed)
Physician Discharge Summary  Charles Porter:[id number] DOB: Sep 22, 1935 DOA: 10/13/2020  PCP: [name], MD  Admit date: 10/13/2020 Discharge date: 10/17/2020  Admitted From: Home  Disposition:  CIR  Recommendations for Outpatient Follow-up:  1. Follow up with PCP in 1-2 weeks 2. Please obtain BMP/CBC in one week 3. Patient needs 3 months of aspirin and plavix, then aspirin alone.  4. Blood Pressure goal 130-150 given bilateral PAD stenosis occlusion    Discharge Condition: Stable  CODE STATUS: Full code Diet recommendation: Heart Healthy  Brief/Interim Summary: 85 year old with no significant past medical history and on chronic patient, does not regularly follow with a doctor presents to the ED complaining of intermittent dizziness, feeling off balance, room spinning sensation.  He has been ambulating with a cane since that time.  Due to persistent symptoms presented to the ED.  Blood pressure was noted to be 249 systolic.  Patient received 10 mg IV hydralazine which decreases blood pressure to 110 systolic he developed acute onset of dysarthria and left facial discomfort.  Since that time his blood pressure has rebounded to 227 his facial droop and dysarthria resolved.  Neurology was consulted.  Patient was diagnosed with acute ischemic cerebellar infarct. Patient will need to be on aspirin and Plavix for 3 months then aspirin alone.  He was a started on Lipitor.  1-Acute ischemic cerebellar infarct: Reports dizziness, vertigo, noted nystagmus at presentation. Right showed left greater than right cerebellar hemisphere infarct.  Multiple remote lacunar infarct involving the cerebral white matter cerebellum. Negative CTA for large vessel occlusion LDL 165, hemoglobin A1c 6.2 Echo ejection fraction 60%, grade 1 diastolic dysfunction Cardiology was consulted for event monitor office will arrange. Continue with aspirin and Plavix for 3 months then aspirin alone.  Of note patient is  currently on 325 aspirin Plan for CIR   2-Hypertensive crisis: Blood pressure goal 130-150 due to bilateral BAL stenosis occlusion, neurology recommendation Continue with Norvasc  3-mild troponin elevation, chronic diastolic heart failure: 4-Hyperlipidemia: LDL 165 started on Lipitor  Discharge Diagnoses:  Principal Problem:   Hypertensive urgency Active Problems:   TIA (transient ischemic attack)   Acute CVA (cerebrovascular accident) [facility])    Discharge Instructions  Discharge Instructions    Ambulatory referral to Neurology   Complete by: As directed    Follow up with stroke clinic NP (Jessica Vanschaick or Darrol Angel, if both not available, consider Manson Allan, or [facility]hern) at [facility] in about 4 weeks. Thanks.   Diet - low sodium heart healthy   Complete by: As directed    Increase activity slowly   Complete by: As directed      Allergies as of 10/17/2020   No Known Allergies     Medication List    STOP taking these medications   BC HEADACHE POWDER PO     TAKE these medications   amLODipine 10 MG tablet Commonly known as: NORVASC Take 1 tablet (10 mg total) by mouth daily. Start taking on: October 18, 2020   aspirin 325 MG tablet Take 1 tablet (325 mg total) by mouth daily. Start taking on: October 18, 2020   atorvastatin 80 MG tablet Commonly known as: LIPITOR Take 1 tablet (80 mg total) by mouth daily. Start taking on: October 18, 2020   clopidogrel 75 MG tablet Commonly known as: PLAVIX Take 1 tablet (75 mg total) by mouth daily. Start taking on: October 18, 2020   docusate sodium 100 MG capsule Commonly known as: COLACE Take 1  capsule (100 mg total) by mouth 2 (two) times daily.       Follow-up Information    [facility]. Schedule an appointment as soon as possible for a visit in 4 week(s).   Specialty: Neurology Contact information: [address] [place] [id number] [phone]             No  Known Allergies  Consultations:  Neurology    Procedures/Studies: CT Angio Head W or Wo Contrast  Result Date: 10/13/2020 CLINICAL DATA:  Initial evaluation for neuro deficit, stroke suspected. EXAM: CT ANGIOGRAPHY HEAD AND NECK CT PERFUSION BRAIN TECHNIQUE: Multidetector CT imaging of the head and neck was performed using the standard protocol during bolus administration of intravenous contrast. Multiplanar CT image reconstructions and MIPs were obtained to evaluate the vascular anatomy. Carotid stenosis measurements (when applicable) are obtained utilizing NASCET criteria, using the distal internal carotid diameter as the denominator. Multiphase CT imaging of the brain was performed following IV bolus contrast injection. Subsequent parametric perfusion maps were calculated using RAPID software. CONTRAST:  100mL OMNIPAQUE IOHEXOL 350 MG/ML SOLN COMPARISON:  Prior CT from earlier the same day. FINDINGS: CTA NECK FINDINGS Aortic arch: Visualized aortic arch normal in caliber with normal 3 vessel morphology. Mild atheromatous change about the arch itself. No hemodynamically significant stenosis about the origin of the great vessels. Visualized subclavian arteries patent. Right carotid system: Right CCA patent from its origin to the bifurcation without stenosis. Scattered calcified and noncalcified plaque about the right carotid bulb/proximal right ICA with associated stenosis of up to 60-70% by NASCET criteria. Right ICA patent distally to the skull base without stenosis, dissection or occlusion. Left carotid system: Left CCA patent from its origin to the bifurcation without stenosis. Scattered mixed plaque about the left carotid bulb/proximal left ICA with associated stenosis of up to 60-70% by NASCET criteria. Left ICA patent distally without stenosis, dissection or occlusion. Vertebral arteries: Both vertebral arteries arise from the subclavian arteries. 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 Scant  distal reconstitution at the right V2 segment, with subsequent reocclusion by the skull base. Multifocal severe stenoses noted involving the proximal left V1 segment. Left vertebral artery otherwise patent to the skull base without stenosis or other acute vascular abnormality. Skeleton: No visible acute osseous finding. No discrete or worrisome osseous lesions. Other neck: No other acute soft tissue abnormality within the neck. 1.2 cm left thyroid nodule noted, felt to be of doubtful significance given size and patient age. No follow-up imaging recommended (ref: J Am Coll Radiol. 2015 Feb;12(2): 143-50). Upper chest: 4 mm nodule partially visualized at the superior segment of the left lower lobe (series 5, image 1). Visualized upper chest demonstrates no other acute finding. Review of the MIP images confirms the above findings CTA HEAD FINDINGS Anterior circulation: Petrous segments patent bilaterally. Scattered atheromatous plaque throughout the carotid siphons with associated moderate to severe multifocal stenoses. A1 segments patent bilaterally. Normal anterior communicating artery complex. Anterior cerebral arteries patent to their distal aspects without stenosis. M1 segments patent bilaterally. Normal MCA bifurcations. Focal moderate to severe proximal left M2 stenosis, inferior division (series 7, image 107). Left MCA branches well perfused distally. No proximal right MCA branch occlusion or stenosis. Diffuse small vessel atheromatous irregularity. Posterior circulation: Left vertebral artery patent as it courses into the cranial vault. Left PICA origin patent and normal. Focal moderate to severe distal left V4 stenosis (series 7, image 129). Right vertebral artery occluded at the skull base. Right  PICA not seen. Basilar diminutive and mildly irregular but patent to its distal aspect without high-grade stenosis. Superior cerebral arteries patent bilaterally. Left PCA primarily supplied via the basilar. Right  PCA supplied via the basilar as well as a robust right posterior communicating artery. Both PCAs widely patent to their distal aspects. Venous sinuses: Patent allowing for timing the contrast bolus. Anatomic variants: None significant.  No aneurysm. Review of the MIP images confirms the above findings CT Brain Perfusion Findings: ASPECTS: Not performed. CBF (<30%) Volume: 46mL Perfusion (Tmax>6.0s) volume: 15mL Mismatch Volume: 9mL Infarction Location:Negative CT perfusion with no evidence for acute ischemia or other perfusion deficit. IMPRESSION: 1. Negative CTA for emergent large vessel occlusion. 2. Negative CT perfusion with no evidence for acute ischemia or other perfusion deficit. 3. Atheromatous change about the carotid bifurcations bilaterally with associated stenoses of up to 60-70% bilaterally. 4. Moderate to severe atherosclerotic change throughout the carotid siphons with associated moderate to severe multifocal stenoses. 5. Occlusion of the right vertebral artery at its origin, with multifocal severe proximal left V1 and distal left V4 stenoses. 6. Focal moderate to severe proximal left M2 stenosis, inferior division. 7. 4 mm left lower lobe nodule, indeterminate. Consider follow-up examination with dedicated chest CT for further evaluation. Critical Value/emergent results were called by telephone at the time of interpretation on 10/13/2020 at 8:28 pm to provider [facility] , who verbally acknowledged these results. Electronically Signed   By: Rise Mu M.D.   On: 10/13/2020 20:34   CT Head Wo Contrast  Result Date: 10/13/2020 CLINICAL DATA:  85 year old male with neurologic deficit. EXAM: CT HEAD WITHOUT CONTRAST TECHNIQUE: Contiguous axial images were obtained from the base of the skull through the vertex without intravenous contrast. COMPARISON:  None. FINDINGS: Brain: Mild age-related atrophy and moderate chronic microvascular ischemic changes. There is no acute intracranial  hemorrhage. No mass effect midline shift. No extra-axial fluid collection. Vascular: Apparent high attenuating focus in the M2 segment of the right MCA (12/2). This is likely artifactual and related to hemoconcentration/dehydration. Similar slightly higher attenuation of the intracranial vasculature noted. If there is clinical concern for right MCA territory infarct, further evaluation with CT is recommended. Skull: Normal. Negative for fracture or focal lesion. Sinuses/Orbits: Mild mucoperiosteal thickening of paranasal sinuses and a small left maxillary sinus retention cyst or polyp. No air-fluid level. The mastoid air cells are clear. Other: None IMPRESSION: 1. No acute intracranial hemorrhage. 2. Age-related atrophy and chronic microvascular ischemic changes. Electronically Signed   By: Elgie Collard M.D.   On: 10/13/2020 16:13   CT Angio Neck W and/or Wo Contrast  Result Date: 10/13/2020 CLINICAL DATA:  Initial evaluation for neuro deficit, stroke suspected. EXAM: CT ANGIOGRAPHY HEAD AND NECK CT PERFUSION BRAIN TECHNIQUE: Multidetector CT imaging of the head and neck was performed using the standard protocol during bolus administration of intravenous contrast. Multiplanar CT image reconstructions and MIPs were obtained to evaluate the vascular anatomy. Carotid stenosis measurements (when applicable) are obtained utilizing NASCET criteria, using the distal internal carotid diameter as the denominator. Multiphase CT imaging of the brain was performed following IV bolus contrast injection. Subsequent parametric perfusion maps were calculated using RAPID software. CONTRAST:  OMNIPAQUE IOHEXOL 350 MG/ML SOLN COMPARISON:  Prior CT from earlier the same day. FINDINGS: CTA NECK FINDINGS Aortic arch: Visualized aortic arch normal in caliber with normal 3 vessel morphology. Mild atheromatous change about the arch itself. No hemodynamically significant stenosis about the origin of the great vessels. Visualized  subclavian arteries patent. Right carotid system: Right CCA patent from its origin to the bifurcation without stenosis. Scattered calcified and noncalcified plaque about the right carotid bulb/proximal right ICA with associated stenosis of up to 60-70% by NASCET criteria. Right ICA patent distally to the skull base without stenosis, dissection or occlusion. Left carotid system: Left CCA patent from its origin to the bifurcation without stenosis. Scattered mixed plaque about the left carotid bulb/proximal left ICA with associated stenosis of up to 60-70% by NASCET criteria. Left ICA patent distally without stenosis, dissection or occlusion. Vertebral arteries: Both vertebral arteries arise from the subclavian arteries. 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 Scant distal reconstitution at the right V2 segment, with subsequent reocclusion by the skull base. Multifocal severe stenoses noted involving the proximal left V1 segment. Left vertebral artery otherwise patent to the skull base without stenosis or other acute vascular abnormality. Skeleton: No visible acute osseous finding. No discrete or worrisome osseous lesions. Other neck: No other acute soft tissue abnormality within the neck. 1.2 cm left thyroid nodule noted, felt to be of doubtful significance given size and patient age. No follow-up imaging recommended (ref: J Am Coll Radiol. 2015 Feb;12(2): 143-50). Upper chest: 4 mm nodule partially visualized at the superior segment of the left lower lobe (series 5, image 1). Visualized upper chest demonstrates no other acute finding. Review of the MIP images confirms the above findings CTA HEAD FINDINGS Anterior circulation: Petrous segments patent bilaterally. Scattered atheromatous plaque throughout the carotid siphons with associated moderate to severe multifocal stenoses. A1 segments patent bilaterally. Normal anterior communicating artery complex. Anterior cerebral arteries patent to their distal aspects  without stenosis. M1 segments patent bilaterally. Normal MCA bifurcations. Focal moderate to severe proximal left M2 stenosis, inferior division (series 7, image 107). Left MCA branches well perfused distally. No proximal right MCA branch occlusion or stenosis. Diffuse small vessel atheromatous irregularity. Posterior circulation: Left vertebral artery patent as it courses into the cranial vault. Left PICA origin patent and normal. Focal moderate to severe distal left V4 stenosis (series 7, image 129). Right vertebral artery occluded at the skull base. Right PICA not seen. Basilar diminutive and mildly irregular but patent to its distal aspect without high-grade stenosis. Superior cerebral arteries patent bilaterally. Left PCA primarily supplied via the basilar. Right PCA supplied via the basilar as well as a robust right posterior communicating artery. Both PCAs widely patent to their distal aspects. Venous sinuses: Patent allowing for timing the contrast bolus. Anatomic variants: None significant.  No aneurysm. Review of the MIP images confirms the above findings CT Brain Perfusion Findings: ASPECTS: Not performed. CBF (<30%) Volume: 0mL Perfusion (Tmax>6.0s) volume: 0mL Mismatch Volume: 0mL Infarction Location:Negative CT perfusion with no evidence for acute ischemia or other perfusion deficit. IMPRESSION: 1. Negative CTA for emergent large vessel occlusion. 2. Negative CT perfusion with no evidence for acute ischemia or other perfusion deficit. 3. Atheromatous change about the carotid bifurcations bilaterally with associated stenoses of up to 60-70% bilaterally. 4. Moderate to severe atherosclerotic change throughout the carotid siphons with associated moderate to severe multifocal stenoses. 5. Occlusion of the right vertebral artery at its origin, with multifocal severe proximal left V1 and distal left V4 stenoses. 6. Focal moderate to severe proximal left M2 stenosis, inferior division. 7. 4 mm left lower lobe  nodule, indeterminate. Consider follow-up examination with dedicated chest CT for further evaluation. Critical Value/emergent results were called by telephone at the time of interpretation on 10/13/2020 at 8:28 pm to provider  MARCY PFEIFFER , who verbally acknowledged these results. Electronically Signed   By: Rise Mu M.D.   On: 10/13/2020 20:34   MR BRAIN WO CONTRAST  Result Date: 10/14/2020 CLINICAL DATA:  Initial evaluation for 4 day history of intermittent dizziness, stroke. EXAM: MRI HEAD WITHOUT CONTRAST TECHNIQUE: Multiplanar, multiecho pulse sequences of the brain and surrounding structures were obtained without intravenous contrast. COMPARISON:  Prior CTs from 10/13/2020 FINDINGS: Brain: Generalized age-related cerebral atrophy. Patchy and confluent T2/FLAIR hyperintensity within the periventricular and deep white matter both cerebral hemispheres most consistent with chronic small vessel ischemic disease, moderate nature. Multiple scattered superimposed remote lacunar infarcts noted within the hemispheric cerebral white matter. Few scattered remote bilateral cerebellar infarcts noted as well. Patchy small volume acute ischemic infarcts seen involving the left greater than right cerebellar hemispheres (series 2, images 16, 13, 10). Largest area of infarction seen on the left and measures 9 mm. No associated hemorrhage or mass effect. No other diffusion abnormality to suggest acute or subacute ischemia. Gray-white matter differentiation otherwise maintained. No other areas of remote cortical infarction. No other foci of susceptibility artifact to suggest acute or chronic intracranial hemorrhage. No mass lesion, midline shift or mass effect. No hydrocephalus or extra-axial fluid collection. Pituitary gland suprasellar region within normal limits. Midline structures intact. Vascular: Poorly visualized flow void within the right vertebral artery, seen to be occluded on prior CTA. Left V4 segment  not well seen on either, also seen to demonstrate a severe stenosis on prior CTA. Major intracranial vascular flow voids are otherwise maintained. Skull and upper cervical spine: Craniocervical junction within normal limits. Heterogeneous signal intensity seen throughout the visualized bone marrow without focal marrow replacing lesion. No scalp soft tissue abnormality. Sinuses/Orbits: Globes and orbital soft tissues within normal limits. Left maxillary sinus retention cyst. Scattered mucosal thickening within the ethmoidal air cells. Paranasal sinuses are otherwise clear. No significant mastoid effusion. Other: None. IMPRESSION: 1. Patchy small volume acute ischemic infarcts involving the left greater than right cerebellar hemispheres. No associated hemorrhage or mass effect. 2. Age-related cerebral atrophy with moderate chronic microvascular ischemic disease, with multiple remote lacunar infarcts involving the hemispheric cerebral white matter and cerebellum. Electronically Signed   By: Rise Mu M.D.   On: 10/14/2020 00:39   CT CEREBRAL PERFUSION W CONTRAST  Result Date: 10/13/2020 CLINICAL DATA:  Initial evaluation for neuro deficit, stroke suspected. EXAM: CT ANGIOGRAPHY HEAD AND NECK CT PERFUSION BRAIN TECHNIQUE: Multidetector CT imaging of the head and neck was performed using the standard protocol during bolus administration of intravenous contrast. Multiplanar CT image reconstructions and MIPs were obtained to evaluate the vascular anatomy. Carotid stenosis measurements (when applicable) are obtained utilizing NASCET criteria, using the distal internal carotid diameter as the denominator. Multiphase CT imaging of the brain was performed following IV bolus contrast injection. Subsequent parametric perfusion maps were calculated using RAPID software. CONTRAST:  OMNIPAQUE IOHEXOL 350 MG/ML SOLN COMPARISON:  Prior CT from earlier the same day. FINDINGS: CTA NECK FINDINGS Aortic arch:  Visualized aortic arch normal in caliber with normal 3 vessel morphology. Mild atheromatous change about the arch itself. No hemodynamically significant stenosis about the origin of the great vessels. Visualized subclavian arteries patent. Right carotid system: Right CCA patent from its origin to the bifurcation without stenosis. Scattered calcified and noncalcified plaque about the right carotid bulb/proximal right ICA with associated stenosis of up to 60-70% by NASCET criteria. Right ICA patent distally to the skull base without stenosis, dissection or occlusion. Left  carotid system: Left CCA patent from its origin to the bifurcation without stenosis. Scattered mixed plaque about the left carotid bulb/proximal left ICA with associated stenosis of up to 60-70% by NASCET criteria. Left ICA patent distally without stenosis, dissection or occlusion. Vertebral arteries: Both vertebral arteries arise from the subclavian arteries. 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 Scant distal reconstitution at the right V2 segment, with subsequent reocclusion by the skull base. Multifocal severe stenoses noted involving the proximal left V1 segment. Left vertebral artery otherwise patent to the skull base without stenosis or other acute vascular abnormality. Skeleton: No visible acute osseous finding. No discrete or worrisome osseous lesions. Other neck: No other acute soft tissue abnormality within the neck. 1.2 cm left thyroid nodule noted, felt to be of doubtful significance given size and patient age. No follow-up imaging recommended (ref: J Am Coll Radiol. 2015 Feb;12(2): 143-50). Upper chest: 4 mm nodule partially visualized at the superior segment of the left lower lobe (series 5, image 1). Visualized upper chest demonstrates no other acute finding. Review of the MIP images confirms the above findings CTA HEAD FINDINGS Anterior circulation: Petrous segments patent bilaterally. Scattered atheromatous plaque throughout  the carotid siphons with associated moderate to severe multifocal stenoses. A1 segments patent bilaterally. Normal anterior communicating artery complex. Anterior cerebral arteries patent to their distal aspects without stenosis. M1 segments patent bilaterally. Normal MCA bifurcations. Focal moderate to severe proximal left M2 stenosis, inferior division (series 7, image 107). Left MCA branches well perfused distally. No proximal right MCA branch occlusion or stenosis. Diffuse small vessel atheromatous irregularity. Posterior circulation: Left vertebral artery patent as it courses into the cranial vault. Left PICA origin patent and normal. Focal moderate to severe distal left V4 stenosis (series 7, image 129). Right vertebral artery occluded at the skull base. Right PICA not seen. Basilar diminutive and mildly irregular but patent to its distal aspect without high-grade stenosis. Superior cerebral arteries patent bilaterally. Left PCA primarily supplied via the basilar. Right PCA supplied via the basilar as well as a robust right posterior communicating artery. Both PCAs widely patent to their distal aspects. Venous sinuses: Patent allowing for timing the contrast bolus. Anatomic variants: None significant.  No aneurysm. Review of the MIP images confirms the above findings CT Brain Perfusion Findings: ASPECTS: Not performed. CBF (<30%) Volume: 0mL Perfusion (Tmax>6.0s) volume: 0mL Mismatch Volume: 0mL Infarction Location:Negative CT perfusion with no evidence for acute ischemia or other perfusion deficit. IMPRESSION: 1. Negative CTA for emergent large vessel occlusion. 2. Negative CT perfusion with no evidence for acute ischemia or other perfusion deficit. 3. Atheromatous change about the carotid bifurcations bilaterally with associated stenoses of up to 60-70% bilaterally. 4. Moderate to severe atherosclerotic change throughout the carotid siphons with associated moderate to severe multifocal stenoses. 5. Occlusion  of the right vertebral artery at its origin, with multifocal severe proximal left V1 and distal left V4 stenoses. 6. Focal moderate to severe proximal left M2 stenosis, inferior division. 7. 4 mm left lower lobe nodule, indeterminate. Consider follow-up examination with dedicated chest CT for further evaluation. Critical Value/emergent results were called by telephone at the time of interpretation on 10/13/2020 at 8:28 pm to provider [facility] , who verbally acknowledged these results. Electronically Signed   By: Rise Mu M.D.   On: 10/13/2020 20:34   ECHOCARDIOGRAM COMPLETE  Result Date: 10/14/2020    ECHOCARDIOGRAM REPORT   Patient Name:   Charles Porter [facility] Date of Exam: 10/14/2020 Medical Rec #:  [id number]  Height:       66.0 in Accession #:    [id number]    Weight:       142.0 lb Date of Birth:  June 26, 1936    BSA:          1.729 m Patient Age:    84 years      BP:           221/66 mmHg Patient Gender: M             HR:           60 bpm. Exam Location:  Inpatient Procedure: 2D Echo Indications:    Stroke I63.9  History:        Patient has no prior history of Echocardiogram examinations.                 Stroke; Risk Factors:Former Smoker and Hypertension. No                 significant PMH.  Sonographer:    Jeryl Columbia Referring Phys: [phone] JARED M GARDNER IMPRESSIONS  1. Left ventricular ejection fraction, by estimation, is 60 to 65%. The left ventricle has normal function. The left ventricle has no regional wall motion abnormalities. There is severe concentric left ventricular hypertrophy. Left ventricular diastolic  parameters are consistent with Grade I diastolic dysfunction (impaired relaxation).  2. Right ventricular systolic function is normal. The right ventricular size is normal.  3. The mitral valve is normal in structure. Trivial mitral valve regurgitation.  4. The aortic valve is tricuspid. There is mild thickening of the aortic valve. Aortic valve regurgitation is not visualized. No  aortic stenosis is present.  5. The inferior vena cava is normal in size with greater than 50% respiratory variability, suggesting right atrial pressure of 3 mmHg. Comparison(s): No prior Echocardiogram. FINDINGS  Left Ventricle: Left ventricular ejection fraction, by estimation, is 60 to 65%. The left ventricle has normal function. The left ventricle has no regional wall motion abnormalities. The left ventricular internal cavity size was small. There is severe concentric left ventricular hypertrophy. Left ventricular diastolic parameters are consistent with Grade I diastolic dysfunction (impaired relaxation). Right Ventricle: The right ventricular size is normal. No increase in right ventricular wall thickness. Right ventricular systolic function is normal. Left Atrium: Left atrial size was normal in size. Right Atrium: Right atrial size was normal in size. Pericardium: There is no evidence of pericardial effusion. Mitral Valve: The mitral valve is normal in structure. Trivial mitral valve regurgitation. Tricuspid Valve: The tricuspid valve is normal in structure. Tricuspid valve regurgitation is not demonstrated. No evidence of tricuspid stenosis. Aortic Valve: The aortic valve is tricuspid. There is mild thickening of the aortic valve. There is mild aortic valve annular calcification. Aortic valve regurgitation is not visualized. No aortic stenosis is present. Pulmonic Valve: The pulmonic valve was grossly normal. Pulmonic valve regurgitation is not visualized. No evidence of pulmonic stenosis. Aorta: The aortic root is normal in size and structure. Venous: The inferior vena cava is normal in size with greater than 50% respiratory variability, suggesting right atrial pressure of 3 mmHg. IAS/Shunts: The atrial septum is grossly normal.  LEFT VENTRICLE PLAX 2D LVIDd:         3.25 cm  Diastology LVIDs:         1.50 cm  LV e' medial:    5.66 cm/s LV PW:         1.50 cm  LV E/e' medial:  8.9 LV IVS:  1.35 cm  LV  e' lateral:   7.18 cm/s LVOT diam:     1.90 cm  LV E/e' lateral: 7.0 LVOT Area:     2.84 cm  RIGHT VENTRICLE RV S prime:     23.30 cm/s TAPSE (M-mode): 2.9 cm LEFT ATRIUM             Index       RIGHT ATRIUM           Index LA diam:        3.20 cm 1.85 cm/m  RA Area:     11.30 cm LA Vol (A2C):   52.1 ml 30.14 ml/m RA Volume:   23.00 ml  13.30 ml/m LA Vol (A4C):   43.7 ml 25.28 ml/m LA Biplane Vol: 48.4 ml 28.00 ml/m   AORTA Ao Root diam: 3.20 cm MITRAL VALVE MV Area (PHT): 1.84 cm    SHUNTS MV Decel Time: 412 msec    Systemic Diam: 1.90 cm MV E velocity: 50.10 cm/s MV A velocity: 93.00 cm/s MV E/A ratio:  0.54 Riley Lam MD Electronically signed by Riley Lam MD Signature Date/Time: 10/14/2020/2:38:51 PM    Final     Subjective: He is following command, feels better.   Discharge Exam: Vitals:   10/17/20 0400 10/17/20 0855  BP: (!) 147/57 (!) 152/73  Pulse: 61 93  Resp: 10 16  Temp: 98.5 F (36.9 C)   SpO2: 98% 100%     General: Pt is alert, awake, not in acute distress Cardiovascular: RRR, S1/S2 +, no rubs, no gallops Respiratory: CTA bilaterally, no wheezing, no rhonchi Abdominal: Soft, NT, ND, bowel sounds + Extremities: no edema, no cyanosis    The results of significant diagnostics from this hospitalization (including imaging, microbiology, ancillary and laboratory) are listed below for reference.     Microbiology: Recent Results (from the past 240 hour(s))  Resp Panel by RT-PCR (Flu A&B, Covid) Nasopharyngeal Swab     Status: None   Collection Time: 10/13/20  4:37 PM   Specimen: Nasopharyngeal Swab; Nasopharyngeal(NP) swabs in vial transport medium  Result Value Ref Range Status   SARS Coronavirus 2 by RT PCR NEGATIVE NEGATIVE Final    Comment: (NOTE) SARS-CoV-2 target nucleic acids are NOT DETECTED.  The SARS-CoV-2 RNA is generally detectable in upper respiratory specimens during the acute phase of infection. The lowest concentration of  SARS-CoV-2 viral copies this assay can detect is 138 copies/mL. A negative result does not preclude SARS-Cov-2 infection and should not be used as the sole basis for treatment or other patient management decisions. A negative result may occur with  improper specimen collection/handling, submission of specimen other than nasopharyngeal swab, presence of viral mutation(s) within the areas targeted by this assay, and inadequate number of viral copies(<138 copies/mL). A negative result must be combined with clinical observations, patient history, and epidemiological information. The expected result is Negative.  Fact Sheet for Patients:  BloggerCourse.com  Fact Sheet for Healthcare Providers:  SeriousBroker.it  This test is no t yet approved or cleared by the [place] FDA and  has been authorized for detection and/or diagnosis of SARS-CoV-2 by FDA under an Emergency Use Authorization (EUA). This EUA will remain  in effect (meaning this test can be used) for the duration of the COVID-19 declaration under Section 564(b)(1) of the Act, 21 U.S.C.section 360bbb-3(b)(1), unless the authorization is terminated  or revoked sooner.       Influenza A by PCR NEGATIVE NEGATIVE Final   Influenza B  by PCR NEGATIVE NEGATIVE Final    Comment: (NOTE) The Xpert Xpress SARS-CoV-2/FLU/RSV plus assay is intended as an aid in the diagnosis of influenza from Nasopharyngeal swab specimens and should not be used as a sole basis for treatment. Nasal washings and aspirates are unacceptable for Xpert Xpress SARS-CoV-2/FLU/RSV testing.  Fact Sheet for Patients: BloggerCourse.com  Fact Sheet for Healthcare Providers: SeriousBroker.it  This test is not yet approved or cleared by the [place] FDA and has been authorized for detection and/or diagnosis of SARS-CoV-2 by FDA under an Emergency Use  Authorization (EUA). This EUA will remain in effect (meaning this test can be used) for the duration of the COVID-19 declaration under Section 564(b)(1) of the Act, 21 U.S.C. section 360bbb-3(b)(1), unless the authorization is terminated or revoked.  Performed at [facility]      Labs: BNP (last 3 results) No results for input(s): BNP in the last 8760 hours. Basic Metabolic Panel: Recent Labs  Lab 10/13/20 1637 10/15/20 0353 10/17/20 0300  NA 138 137 138  K 4.3 4.0 3.8  CL 101 102 105  CO2 25 23 27   GLUCOSE 85 73 96  BUN 26* 18 22  CREATININE 1.15 1.12 1.16  CALCIUM 9.3 8.9 9.1   Liver Function Tests: Recent Labs  Lab 10/13/20 1637  AST 44*  ALT 19  ALKPHOS 72  BILITOT 1.1  PROT 7.7  ALBUMIN 3.9   No results for input(s): LIPASE, AMYLASE in the last 168 hours. No results for input(s): AMMONIA in the last 168 hours. CBC: Recent Labs  Lab 10/13/20 1637 10/15/20 0353 10/17/20 0300  WBC 7.4 7.2 7.0  NEUTROABS 4.3 3.7  --   HGB 14.9 15.5 15.1  HCT 45.7 46.0 45.1  MCV 93.3 92.7 92.8  PLT 269 260 293   Cardiac Enzymes: No results for input(s): CKTOTAL, CKMB, CKMBINDEX, TROPONINI in the last 168 hours. BNP: Invalid input(s): POCBNP CBG: Recent Labs  Lab 10/13/20 1919  GLUCAP 103*   D-Dimer No results for input(s): DDIMER in the last 72 hours. Hgb A1c No results for input(s): HGBA1C in the last 72 hours. Lipid Profile No results for input(s): CHOL, HDL, LDLCALC, TRIG, CHOLHDL, LDLDIRECT in the last 72 hours. Thyroid function studies No results for input(s): TSH, T4TOTAL, T3FREE, THYROIDAB in the last 72 hours.  Invalid input(s): FREET3 Anemia work up No results for input(s): VITAMINB12, FOLATE, FERRITIN, TIBC, IRON, RETICCTPCT in the last 72 hours. Urinalysis    Component Value Date/Time   COLORURINE YELLOW 10/13/2020 1637   APPEARANCEUR CLEAR 10/13/2020 1637   LABSPEC 1.027 10/13/2020 1637   PHURINE 5.5 10/13/2020 1637    GLUCOSEU NEGATIVE 10/13/2020 1637   HGBUR NEGATIVE 10/13/2020 1637   BILIRUBINUR NEGATIVE 10/13/2020 1637   KETONESUR 40 (A) 10/13/2020 1637   PROTEINUR 30 (A) 10/13/2020 1637   NITRITE NEGATIVE 10/13/2020 1637   LEUKOCYTESUR NEGATIVE 10/13/2020 1637   Sepsis Labs Invalid input(s): PROCALCITONIN,  WBC,  LACTICIDVEN Microbiology Recent Results (from the past 240 hour(s))  Resp Panel by RT-PCR (Flu A&B, Covid) Nasopharyngeal Swab     Status: None   Collection Time: 10/13/20  4:37 PM   Specimen: Nasopharyngeal Swab; Nasopharyngeal(NP) swabs in vial transport medium  Result Value Ref Range Status   SARS Coronavirus 2 by RT PCR NEGATIVE NEGATIVE Final    Comment: (NOTE) SARS-CoV-2 target nucleic acids are NOT DETECTED.  The SARS-CoV-2 RNA is generally detectable in upper respiratory specimens during the acute phase of infection. The lowest concentration of  SARS-CoV-2 viral copies this assay can detect is 138 copies/mL. A negative result does not preclude SARS-Cov-2 infection and should not be used as the sole basis for treatment or other patient management decisions. A negative result may occur with  improper specimen collection/handling, submission of specimen other than nasopharyngeal swab, presence of viral mutation(s) within the areas targeted by this assay, and inadequate number of viral copies(<138 copies/mL). A negative result must be combined with clinical observations, patient history, and epidemiological information. The expected result is Negative.  Fact Sheet for Patients:  BloggerCourse.com  Fact Sheet for Healthcare Providers:  SeriousBroker.it  This test is no t yet approved or cleared by the [place] FDA and  has been authorized for detection and/or diagnosis of SARS-CoV-2 by FDA under an Emergency Use Authorization (EUA). This EUA will remain  in effect (meaning this test can be used) for the duration of  the COVID-19 declaration under Section 564(b)(1) of the Act, 21 U.S.C.section 360bbb-3(b)(1), unless the authorization is terminated  or revoked sooner.       Influenza A by PCR NEGATIVE NEGATIVE Final   Influenza B by PCR NEGATIVE NEGATIVE Final    Comment: (NOTE) The Xpert Xpress SARS-CoV-2/FLU/RSV plus assay is intended as an aid in the diagnosis of influenza from Nasopharyngeal swab specimens and should not be used as a sole basis for treatment. Nasal washings and aspirates are unacceptable for Xpert Xpress SARS-CoV-2/FLU/RSV testing.  Fact Sheet for Patients: BloggerCourse.com  Fact Sheet for Healthcare Providers: SeriousBroker.it  This test is not yet approved or cleared by the [place] FDA and has been authorized for detection and/or diagnosis of SARS-CoV-2 by FDA under an Emergency Use Authorization (EUA). This EUA will remain in effect (meaning this test can be used) for the duration of the COVID-19 declaration under Section 564(b)(1) of the Act, 21 U.S.C. section 360bbb-3(b)(1), unless the authorization is terminated or revoked.  Performed at [facility]      Time coordinating discharge: 40 minutes  SIGNED:   Alba Cory, MD  [facility]

## 2020-10-17 NOTE — Progress Notes (Signed)
Inpatient Rehabilitation Admissions Coordinator  I have insurance approval and CIR bed to admit patient to today. I contacted Dr. Tyrell Antonio for d/c order. I have notified acute team, TOC, spoke with his wife by phone and left his daughter a Advertising account executive. Met with patient at bedside and he is in agreement. I will make the arrangements.  Danne Baxter, RN, MSN Rehab Admissions Coordinator [phone] 10/17/2020 11:54 AM

## 2020-10-17 NOTE — Progress Notes (Signed)
Ranelle Oyster, MD  Physician  Physical Medicine and Rehabilitation  Consult Note      Signed  Date of Service:  10/16/2020  5:44 AM      Related encounter: ED to Hosp-Admission (Discharged) from 10/13/2020 in [place] [facility]       Signed      Expand All Collapse All     Show:Clear all [x] Manual[x] Template[] Copied  Added by: [x] Angiulli, , PA-C[x] , MD   [] Hover for details           Physical Medicine and Rehabilitation Consult Reason for Consult: Left facial droop with dysarthria Referring Physician: Dr.Ezenduka     HPI: Charles Porter is a 85 y.o. right-handed male with unremarkable past medical history except tobacco use.  She does not regularly follow-up with her PCP.  Per chart review lives with spouse.  Independent prior to admission.  1 level home.  Wife is limited physically to provide assistance.  Presented to [facility] the ED 10/13/2020 with left facial droop and dysarthria as well as dizziness x4 days.  BP noted 249 systolic.  Cranial CT scan negative.  Patient did not receive TPA.  CTA of head and neck negative for emergent large vessel occlusion but did show severe b/l VA stenosis/occlusion.  There was an incidental finding of 4 mm left lower lobe nodule indeterminate recommendations outpatient follow-up.  MRI of the brain showed patchy small volume acute ischemic infarct involving the left greater than right cerebellar hemispheres.  No associated hemorrhage or mass-effect.  Admission chemistries unremarkable except BUN 26, troponin 67-73, urinalysis negative nitrite, hemoglobin A1c 6.2.  Echocardiogram with ejection fraction of 60 to 65% grade 1 diastolic dysfunction.  Follow-up cardiology services for mild elevation in troponin recommendations for event monitor placement 10/15/2020.  Neurology follow-up currently maintained on aspirin 325 mg daily and Plavix 75 mg daily for 3 months then aspirin alone.  Subcutaneous Lovenox  for DVT prophylaxis therapy evaluations completed due to patient decreased functional mobility recommendations of physical medicine rehab consult.     Review of Systems  Constitutional: Negative for chills and fever.  HENT: Negative for hearing loss.   Eyes: Negative for blurred vision and double vision.  Respiratory: Negative for cough and shortness of breath.   Cardiovascular: Negative for chest pain, palpitations and leg swelling.  Gastrointestinal: Positive for constipation. Negative for heartburn, nausea and vomiting.  Genitourinary: Negative for dysuria, flank pain and hematuria.  Musculoskeletal: Positive for joint pain and myalgias.  Skin: Negative for rash.  Neurological: Positive for dizziness, speech change, weakness and headaches.  All other systems reviewed and are negative.   History reviewed. No pertinent past medical history. History reviewed. No pertinent surgical history.      Family History  Problem Relation Age of Onset  . Hypertension Neg Hx      Social History:  reports that he has quit smoking. His smoking use included cigarettes. He has never used smokeless tobacco. He reports that he does not drink alcohol and does not use drugs. Allergies: No Known Allergies       Medications Prior to Admission  Medication Sig Dispense Refill  . Aspirin-Salicylamide-Caffeine (BC HEADACHE POWDER PO) Take 1 packet by mouth daily as needed (pain).          Home: Home Living Family/patient expects to be discharged to:: Private residence Living Arrangements: Spouse/significant other Available Help at Discharge: Family Type of Home: House Home Layout: One level Bathroom Shower/Tub: Tub/shower unit,Walk-in shower  Lives With: Spouse  Functional History: Prior Function Level of Independence: Independent Functional Status:  Mobility: Bed Mobility Overal bed mobility: Needs Assistance Bed Mobility: Supine to Sit,Sit to Supine Supine to sit: Min guard Sit to supine: Min  guard General bed mobility comments: Due to vertigo, maintained HOB elevated. Educated pt on finding a fixed target to look at as coming to EOB and maintain once seated. Pt did well with few cues needed to keep focus Transfers Overall transfer level: Needs assistance Equipment used: 1 person hand held assist Transfers: Sit to/from Stand Sit to Stand: Min assist General transfer comment: cued to use wide BOS and focus on object as come to stand; minimal sway noted on initial standing Ambulation/Gait Ambulation/Gait assistance: Min assist Gait Distance (Feet): 30 Feet Assistive device: 1 person hand held assist Gait Pattern/deviations: Step-through pattern,Decreased stride length,Wide base of support General Gait Details: very rigid, short steps. Cues for focusing on specific objects as he moved through the room and turned (turn head, fixate on target, turn feet). By the time seated back on EOB pt reported he felt slightly nauseated and cued pt to close his eyes which he reported he felt better Gait velocity: very slow Gait velocity interpretation: <1.31 ft/sec, indicative of household ambulator   ADL: ADL Overall ADL's : Needs assistance/impaired Eating/Feeding: Independent,Sitting Grooming: Wash/dry hands,Wash/dry face,Min guard,Standing Upper Body Bathing: Set up,Supervision/ safety,Sitting Lower Body Bathing: Minimal assistance Upper Body Dressing : Set up,Supervision/safety,Sitting Lower Body Dressing: Minimal assistance Toilet Transfer: Ambulation,Minimal assistance,Cueing for safety Toileting- Clothing Manipulation and Hygiene: Min guard,Sit to/from stand Functional mobility during ADLs: Min guard   Cognition: Cognition Overall Cognitive Status: Within Functional Limits for tasks assessed Arousal/Alertness: Awake/alert Orientation Level: Oriented X4 Attention: Focused,Sustained Focused Attention: Appears intact Sustained Attention: Appears intact Memory: Appears  intact Awareness: Appears intact Problem Solving: Appears intact Executive Function: Organizing,Sequencing Sequencing: Appears intact Safety/Judgment: Appears intact Cognition Arousal/Alertness: Awake/alert Behavior During Therapy: WFL for tasks assessed/performed Overall Cognitive Status: Within Functional Limits for tasks assessed   Blood pressure (!) 146/87, pulse 77, temperature 97.9 F (36.6 C), temperature source Oral, resp. rate 12, height 5\' 6"  (1.676 m), weight 62.9 kg, SpO2 98 %. Physical Exam Constitutional:      Appearance: Normal appearance.  HENT:     Head: Normocephalic.     Nose: Nose normal.     Mouth/Throat:     Mouth: Mucous membranes are moist.  Eyes:     Conjunctiva/sclera: Conjunctivae normal.     Pupils: Pupils are equal, round, and reactive to light.  Cardiovascular:     Rate and Rhythm: Normal rate.     Pulses: Normal pulses.  Pulmonary:     Effort: Pulmonary effort is normal.  Abdominal:     Palpations: Abdomen is soft.  Musculoskeletal:        General: Normal range of motion.     Cervical back: Normal range of motion.  Skin:    General: Skin is warm.  Neurological:     Mental Status: He is alert.     Comments: Patient is alert in no acute distress.  Makes eye contact with examiner and follows commands.  Oriented x3. Speech clear, normal language, reasonable insight and awareness, functional memory. 3-4 beats of nystagmus with rightward gaze, 1-2 beats to the left. No limb ataxia, mild dysmetria. Strength 4/5 UE and 4/5 LE. Senses pain and light touch in all 4's.   Psychiatric:        Mood and Affect: Mood normal.  Behavior: Behavior normal.        Lab Results Last 24 Hours  No results found for this or any previous visit (from the past 24 hour(s)).    Imaging Results (Last 48 hours)  ECHOCARDIOGRAM COMPLETE   Result Date: 10/14/2020    ECHOCARDIOGRAM REPORT   Patient Name:   Charles Porter [facility] Date of Exam: 10/14/2020 Medical Rec #:   [id number]     Height:       66.0 in Accession #:    [id number]    Weight:       142.0 lb Date of Birth:  10/07/1935    BSA:          1.729 m Patient Age:    84 years      BP:           221/66 mmHg Patient Gender: M             HR:           60 bpm. Exam Location:  Inpatient Procedure: 2D Echo Indications:    Stroke I63.9  History:        Patient has no prior history of Echocardiogram examinations.                 Stroke; Risk Factors:Former Smoker and Hypertension. No                 significant PMH.  Sonographer:    Jeryl Columbia Referring Phys: [phone] JARED M GARDNER IMPRESSIONS  1. Left ventricular ejection fraction, by estimation, is 60 to 65%. The left ventricle has normal function. The left ventricle has no regional wall motion abnormalities. There is severe concentric left ventricular hypertrophy. Left ventricular diastolic  parameters are consistent with Grade I diastolic dysfunction (impaired relaxation).  2. Right ventricular systolic function is normal. The right ventricular size is normal.  3. The mitral valve is normal in structure. Trivial mitral valve regurgitation.  4. The aortic valve is tricuspid. There is mild thickening of the aortic valve. Aortic valve regurgitation is not visualized. No aortic stenosis is present.  5. The inferior vena cava is normal in size with greater than 50% respiratory variability, suggesting right atrial pressure of 3 mmHg. Comparison(s): No prior Echocardiogram. FINDINGS  Left Ventricle: Left ventricular ejection fraction, by estimation, is 60 to 65%. The left ventricle has normal function. The left ventricle has no regional wall motion abnormalities. The left ventricular internal cavity size was small. There is severe concentric left ventricular hypertrophy. Left ventricular diastolic parameters are consistent with Grade I diastolic dysfunction (impaired relaxation). Right Ventricle: The right ventricular size is normal. No increase in right ventricular wall  thickness. Right ventricular systolic function is normal. Left Atrium: Left atrial size was normal in size. Right Atrium: Right atrial size was normal in size. Pericardium: There is no evidence of pericardial effusion. Mitral Valve: The mitral valve is normal in structure. Trivial mitral valve regurgitation. Tricuspid Valve: The tricuspid valve is normal in structure. Tricuspid valve regurgitation is not demonstrated. No evidence of tricuspid stenosis. Aortic Valve: The aortic valve is tricuspid. There is mild thickening of the aortic valve. There is mild aortic valve annular calcification. Aortic valve regurgitation is not visualized. No aortic stenosis is present. Pulmonic Valve: The pulmonic valve was grossly normal. Pulmonic valve regurgitation is not visualized. No evidence of pulmonic stenosis. Aorta: The aortic root is normal in size and structure. Venous: The inferior vena cava is normal in size with greater than 50%  respiratory variability, suggesting right atrial pressure of 3 mmHg. IAS/Shunts: The atrial septum is grossly normal.  LEFT VENTRICLE PLAX 2D LVIDd:         3.25 cm  Diastology LVIDs:         1.50 cm  LV e' medial:    5.66 cm/s LV PW:         1.50 cm  LV E/e' medial:  8.9 LV IVS:        1.35 cm  LV e' lateral:   7.18 cm/s LVOT diam:     1.90 cm  LV E/e' lateral: 7.0 LVOT Area:     2.84 cm  RIGHT VENTRICLE RV S prime:     23.30 cm/s TAPSE (M-mode): 2.9 cm LEFT ATRIUM             Index       RIGHT ATRIUM           Index LA diam:        3.20 cm 1.85 cm/m  RA Area:     11.30 cm LA Vol (A2C):   52.1 ml 30.14 ml/m RA Volume:   23.00 ml  13.30 ml/m LA Vol (A4C):   43.7 ml 25.28 ml/m LA Biplane Vol: 48.4 ml 28.00 ml/m   AORTA Ao Root diam: 3.20 cm MITRAL VALVE MV Area (PHT): 1.84 cm    SHUNTS MV Decel Time: 412 msec    Systemic Diam: 1.90 cm MV E velocity: 50.10 cm/s MV A velocity: 93.00 cm/s MV E/A ratio:  0.54 Riley LamMahesh Chandrasekhar MD Electronically signed by Riley LamMahesh Chandrasekhar MD Signature  Date/Time: 10/14/2020/2:38:51 PM    Final          Assessment/Plan: Diagnosis: bilateral cerebellar infarcts 1. Does the need for close, 24 hr/day medical supervision in concert with the patient's rehab needs make it unreasonable for this patient to be served in a less intensive setting? Yes 2. Co-Morbidities requiring supervision/potential complications: HTN 3. Due to bladder management, bowel management, safety, skin/wound care, disease management, medication administration, pain management and patient education, does the patient require 24 hr/day rehab nursing? Yes 4. Does the patient require coordinated care of a physician, rehab nurse, therapy disciplines of PT, OT to address physical and functional deficits in the context of the above medical diagnosis(es)? Yes Addressing deficits in the following areas: balance, endurance, locomotion, strength, transferring, bowel/bladder control, bathing, dressing, feeding, grooming, toileting and psychosocial support 5. Can the patient actively participate in an intensive therapy program of at least 3 hrs of therapy per day at least 5 days per week? Yes 6. The potential for patient to make measurable gains while on inpatient rehab is excellent 7. Anticipated functional outcomes upon discharge from inpatient rehab are modified independent and supervision  with PT, modified independent and supervision with OT, n/a with SLP. 8. Estimated rehab length of stay to reach the above functional goals is: 8-12 days 9. Anticipated discharge destination: Home 10. Overall Rehab/Functional Prognosis: excellent   RECOMMENDATIONS: This patient's condition is appropriate for continued rehabilitative care in the following setting: CIR Patient has agreed to participate in recommended program. Yes Note that insurance prior authorization may be required for reimbursement for recommended care.   Comment: Rehab Admissions Coordinator to follow up.   Thanks,   Ranelle OysterZachary T.  Swartz, MD, [place] DomFAAPMR   I have personally performed a face to face diagnostic evaluation of this patient. Additionally, I have examined pertinent labs and radiographic images. I have reviewed and concur with the physician assistant's  documentation above.     Charlton Amor, PA-C 10/16/2020          Revision History                        Routing History              Note Details  Author Ranelle Oyster, MD File Time 10/16/2020  1:10 PM  Author Type Physician Status Signed  Last Editor Ranelle Oyster, MD Service Physical Medicine and Rehabilitation  [facility] Acct # [id number] Admit Date 10/17/2020

## 2020-10-17 NOTE — H&P (Signed)
Physical Medicine and Rehabilitation Admission H&P    Chief Complaint  Patient presents with  . Stroke with functional deficits   HPI: Charles Porter is an 85 year old male in relatively good health but no medical care for years; who was admitted on 10/13/2020 with 4-day history of dizziness with feeling of off balance and found to have malignant hypertension with SBP- 240's.  History taken from chart review and patient. Charles Porter was treated with hydralazine with significant drop and onset of dysarthria and left facial droop. 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 Incidental note of 1.2 cm thyroid nodule and 4 mm LLL nodule.  Echocardiogram with ejection fraction of 60 to 65%, severe concentric LVH with grade 1 DD.  Dr. Roda Shutters felt that stroke likely embolic due to VA stenosis/occlusion but need to rule out cardioembolic source. SBP goal 140-150 range given b/l VA stenosis and outpatient follow up with [facility].   Neurology recommended ASA 325 mg/Plavix X 3 months followed by ASA alone.  30 day cardiac event monitor recommended at discharge--to contact cardiology prior to d/c. Patient continues to be limited by dizziness with balance deficits as well as significant elevations in BP with activity. Today reporting increase in nausea felt to be due to constipation. CIR recommended due to functional decline.  Please see preadmission assessment from earlier today as well.  Review of Systems  Constitutional: Negative for chills and fever.  HENT: Positive for hearing loss. Negative for tinnitus.   Eyes: Negative for blurred vision and double vision.  Respiratory: Negative for cough and shortness of breath.   Cardiovascular: Negative for chest pain and  palpitations.  Gastrointestinal: Positive for constipation and nausea (with activity). Negative for vomiting.  Genitourinary: Negative for dysuria and urgency.  Musculoskeletal: Negative for joint pain and myalgias.  Skin: Negative for itching and rash.  Neurological: Positive for dizziness and headaches (occipital HA with pressure). Negative for sensory change, speech change, focal weakness and weakness.  Psychiatric/Behavioral: Negative for memory loss. The patient does not have insomnia.   All other systems reviewed and are negative.   No known PMH.  No PSH.   Family History  Problem Relation Age of Onset  . Hypertension Neg Hx     Social History: Charles Porter has back issues.  Still works for [employer]. Charles Porter reports that Charles Porter has quit smoking. His smoking use included cigarettes. Charles Porter has never used smokeless tobacco. Charles Porter reports that Charles Porter does not drink alcohol and does not use drugs.   Allergies: No Known Allergies    Medications Prior to Admission  Medication Sig Dispense Refill  . Aspirin-Salicylamide-Caffeine (BC HEADACHE POWDER PO) Take 1 packet by mouth daily as needed (pain).      Drug Regimen Review  Drug regimen was reviewed and remains appropriate with no significant issues identified  Home: Home Living Family/patient expects to be discharged to:: Private residence Living Arrangements: Spouse/significant other Available Help at Discharge: Family Type of Home: House Home Access: Stairs to enter Secretary/administrator of Steps: 1 Home Layout: Two level,Bed/bath upstairs Alternate Level Stairs-Number of Steps: 14 Alternate Level Stairs-Rails: Right Bathroom Shower/Tub: Tub/shower unit,Walk-in shower Home Equipment: None  Lives With: Spouse   Functional History: Prior Function Level of Independence: Independent  Functional Status:  Mobility: Bed Mobility Overal  bed mobility: Needs Assistance Bed Mobility: Rolling,Sidelying to Sit Rolling:  Supervision Sidelying to sit: Min assist (with rail) Supine to sit: Min guard Sit to supine: Min guard General bed mobility comments: rolled from right to left with incr vertigo; rested and then side to sit with rail and min assist to steady/balance. cues for visual fixation and appropriate target Transfers Overall transfer level: Needs assistance Equipment used: Rolling walker (2 wheeled) Transfers: Sit to/from Stand Sit to Stand: Min assist General transfer comment: cued to use wide BOS and focus on object as come to stand; minimal sway noted on initial standing and left lean Ambulation/Gait Ambulation/Gait assistance: Min assist Gait Distance (Feet): 55 Feet (seated rest; 15 ft) Assistive device: Rolling walker (2 wheeled) Gait Pattern/deviations: Step-through pattern,Decreased stride length,Wide base of support General Gait Details: very rigid, short steps. Cues for focusing on specific objects as Charles Porter moved and turned (turn head, fixate on target, turn feet). Incresing left lean as Charles Porter fatigued. Gait velocity: very slow Gait velocity interpretation: <1.31 ft/sec, indicative of household ambulator    ADL: ADL Overall ADL's : Needs assistance/impaired Eating/Feeding: Independent,Sitting Grooming: Wash/dry hands,Wash/dry face,Min guard,Standing Upper Body Bathing: Set up,Supervision/ safety,Sitting Lower Body Bathing: Minimal assistance Upper Body Dressing : Set up,Supervision/safety,Sitting Lower Body Dressing: Minimal assistance Toilet Transfer: Ambulation,Minimal assistance,Cueing for safety Toileting- Clothing Manipulation and Hygiene: Min guard,Sit to/from stand Functional mobility during ADLs: Min guard  Cognition: Cognition Overall Cognitive Status: Within Functional Limits for tasks assessed Arousal/Alertness: Awake/alert Orientation Level: Oriented X4 Attention: Focused,Sustained Focused Attention: Appears intact Sustained Attention: Appears intact Memory: Appears  intact Awareness: Appears intact Problem Solving: Appears intact Executive Function: Organizing,Sequencing Sequencing: Appears intact Safety/Judgment: Appears intact Cognition Arousal/Alertness: Awake/alert Behavior During Therapy: WFL for tasks assessed/performed Overall Cognitive Status: Within Functional Limits for tasks assessed   Blood pressure (!) 152/73, pulse 93, temperature 98.5 F (36.9 C), temperature source Oral, resp. rate 16, height 5\' 6"  (1.676 m), weight 62.9 kg, SpO2 100 %. Physical Exam Vitals and nursing note reviewed.  Constitutional:      General: Charles Porter is not in acute distress.    Appearance: Charles Porter is normal weight.     Comments: Tends to hold his head (hurts when supine)  HENT:     Head: Normocephalic and atraumatic.     Right Ear: External ear normal.     Left Ear: External ear normal.     Nose: Nose normal.  Eyes:     General:        Right eye: No discharge.        Left eye: No discharge.     Extraocular Movements: Extraocular movements intact.  Cardiovascular:     Rate and Rhythm: Normal rate and regular rhythm.  Pulmonary:     Effort: Pulmonary effort is normal. No respiratory distress.     Breath sounds: No stridor.  Abdominal:     General: Abdomen is flat. Bowel sounds are normal. There is no distension.  Musculoskeletal:     Cervical back: Normal range of motion and neck supple.     Comments: No edema or tenderness in extremities  Skin:    General: Skin is warm and dry.  Neurological:     Mental Status: Charles Porter is alert and oriented to person, place, and time.     Comments: Alert. Decreased hearing No ataxia/dysmetria bilateral upper extremities Motor: 5/5 throughout  Psychiatric:        Behavior: Behavior normal.     Comments: Somewhat slowed versus hard of hearing  Results for orders placed or performed during the hospital encounter of 10/13/20 (from the past 48 hour(s))  CBC     Status: None   Collection Time: 10/17/20  3:00 AM  Result  Value Ref Range   WBC 7.0 4.0 - 10.5 K/uL   RBC 4.86 4.22 - 5.81 MIL/uL   Hemoglobin 15.1 13.0 - 17.0 g/dL   HCT 75.9 16.3 - 84.6 %   MCV 92.8 80.0 - 100.0 fL   MCH 31.1 26.0 - 34.0 pg   MCHC 33.5 30.0 - 36.0 g/dL   RDW 65.9 93.5 - 70.1 %   Platelets 293 150 - 400 K/uL   nRBC 0.0 0.0 - 0.2 %    Comment: Performed at [facility], [address] [place], [place] [address]  Basic metabolic panel     Status: None   Collection Time: 10/17/20  3:00 AM  Result Value Ref Range   Sodium 138 135 - 145 mmol/L   Potassium 3.8 3.5 - 5.1 mmol/L   Chloride 105 98 - 111 mmol/L   CO2 27 22 - 32 mmol/L   Glucose, Bld 96 70 - 99 mg/dL    Comment: Glucose reference range applies only to samples taken after fasting for at least 8 hours.   BUN 22 8 - 23 mg/dL   Creatinine, Ser 0.30 0.61 - 1.24 mg/dL   Calcium 9.1 8.9 - 09.2 mg/dL   GFR, Estimated >33 >00 mL/min    Comment: (NOTE) Calculated using the CKD-EPI Creatinine Equation (2021)    Anion gap 6 5 - 15    Comment: Performed at [facility], [address] [place], [place] [address]   No results found.     Medical Problem List and Plan: 1.  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  -patient may shower  -ELOS/Goals: 7-10 days/supervision/Mod I  Admit to CIR 2.  Antithrombotics: -DVT/anticoagulation:  Pharmaceutical: Lovenox  -antiplatelet therapy: DAPT 3. Pain Management:  N/A 4. Mood: LCSW to follow up for evaluation and support.   -antipsychotic agents: N/A 5. Neuropsych: This patient is capable of making decisions on his own behalf. 6. Skin/Wound Care: Routine pressure relief measures  7. Fluids/Electrolytes/Nutrition: Monitor I/Os 8. HTN: Monitor BP tid  Norvasc  Monitor with increased mobility 9.  Hyperlipidemia: Lipitor 10. Vestibular symptoms: May need to pre-medicate prior to  activity.  --Will add scopolamine patch.   --Vestibular eval  11. Slow transit constipation: Started on colace today. Will give dose of miralax also.   Increase bowel meds as necessary  Jacquelynn Cree, PA-C 10/17/2020  I have personally performed a face to face diagnostic evaluation, including, but not limited to relevant history and physical exam findings, of this patient and developed relevant assessment and plan.  Additionally, I have reviewed and concur with the physician assistant's documentation above.  Maryla Morrow, MD, ABPMR

## 2020-10-17 NOTE — Progress Notes (Signed)
Inpatient Rehabilitation Medication Review by a Pharmacist  A complete drug regimen review was completed for this patient to identify any potential clinically significant medication issues.  Clinically significant medication issues were identified:  No  Check AMION for pharmacist assigned to patient if future medication questions/issues arise during this admission.  Time spent performing this drug regimen review (minutes):  15  Vicki Mallet, PharmD, BCPS, [facility] Clinical Pharmacist 10/17/2020 5:37 PM

## 2020-10-17 NOTE — Progress Notes (Signed)
Marcello Fennel, MD  Physician  Physical Medicine and Rehabilitation  PMR Pre-admission      Addendum  Date of Service:  10/17/2020 12:03 PM      Related encounter: ED to Hosp-Admission (Discharged) from 10/13/2020 in [place] [facility]           Show:Clear all [x] Manual[x] Template[x] Copied  Added by: [x] Yishai Rehfeld, , RN[x] , MD   [] Hover for details  PMR Admission Coordinator Pre-Admission Assessment   Patient: Charles Porter is an 85 y.o., male MRN: DOB: Mar 02, 1936 Height: 5\' 6"  (167.6 cm) Weight: 62.9 kg                                                                                                                                                  Insurance Information HMO:     PPO:      PCP:      IPA:      80/20:     OTHER:  PRIMARY: BCBS of 97      Policy#[id number]      Subscriber: pt CM Name: faxed approval      Phone#: [phone]   Fax#: Pre-Cert#: [place] approved until 4/12 will need to use form provided to give concurrent reviews with the rehab tool. On 4/11    Employer: [phone] Benefits:  Phone #: [phone]     Name: 4/5 Eff. Date: 02/12/2020     Deduct: $200      Out of Pocket Max: $500  CIR: 80%      SNF: 80% limited by medical neccesity Outpatient: 80%     Co-Pay: per medical neccesity for visits Home Health: 80%      Co-Pay: visits per medical neccesity DME: 80%     Co-Pay: 20% Providers: in network  SECONDARY: Medicare a and b      Policy#: [id number] A active 06/14/2003 and b 07/15/2007   Financial Counselor:       Phone#:    The "Data Collection Information Summary" for patients in Inpatient Rehabilitation Facilities with attached "Privacy Act Statement-Health Care Records" was provided and verbally reviewed with: Patient and Family   Emergency Contact Information Contact Information       Name Relation Home Work Mobile    Graham,JANET S   [phone]        04/13/2020 Daughter     [phone]         Current Medical History  Patient Admitting Diagnosis: CVA   History of Present Illness: 85 year old male in relatively good health but no medical care for years; who was admitted on 10/13/20 with 4 day history of dizziness with off balance feeling and 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 He was treated with hydralazine with significant drop and onset of dysarthria and left facial droop. 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 Incidental note of 1.2 cm thyroid nodule and 4 mm LLL nodule. 2D echo with EF 60-65% and severe concentric LVH with grade 1 DD.  Dr. Roda Shutters felt that stroke likely embolic due to VA stenosis/occlusion but need to rule out cardioembolic source. SBP goal 140-150 range given b/l VA stenosis and outpatient follow up with [facility].    Neurology recommended ASA 325 mg/Plavix X 3 months followed by ASA alone.  30 day cardiac event monitor recommended at discharge--to contact cardiology prior to d/c. Patient continues to be limited by dizziness with balance deficits as well as significant elevations in BP with activity. Today reporting increase in nausea felt to be due to constipation     Past Medical History  History reviewed. No pertinent past medical history.   Family History  family history is not on file.   Prior Rehab/Hospitalizations:  Has the patient had prior rehab or hospitalizations prior to admission? Yes   Has the patient had major surgery during 100 days prior to admission? No   Current Medications    Current Facility-Administered Medications:  .   stroke: mapping our early stages of recovery book, , Does not apply, Once, Julian Reil, Jared M, DO .  acetaminophen  (TYLENOL) tablet 650 mg, 650 mg, Oral, Q4H PRN, 650 mg at 10/14/20 0042 **OR** acetaminophen (TYLENOL) 160 MG/5ML solution 650 mg, 650 mg, Per Tube, Q4H PRN **OR** acetaminophen (TYLENOL) suppository 650 mg, 650 mg, Rectal, Q4H PRN, Hillary Bow, DO .  amLODipine (NORVASC) tablet 10 mg, 10 mg, Oral, Daily, Marvel Plan, MD, 10 mg at 10/17/20 0855 .  [DISCONTINUED] aspirin suppository 300 mg, 300 mg, Rectal, Daily **OR** aspirin tablet 325 mg, 325 mg, Oral, Daily, Hillary Bow, DO, 325 mg at 10/17/20 0855 .  atorvastatin (LIPITOR) tablet 80 mg, 80 mg, Oral, Daily, Briant Cedar, MD, 80 mg at 10/17/20 0854 .  clopidogrel (PLAVIX) tablet 75 mg, 75 mg, Oral, Daily, Marvel Plan, MD, 75 mg at 10/17/20 0855 .  docusate sodium (COLACE) capsule 100 mg, 100 mg, Oral, BID, Regalado, Belkys A, MD, 100 mg at 10/17/20 1202 .  enoxaparin (LOVENOX) injection 40 mg, 40 mg, Subcutaneous, QHS, Gardner, Jared M, DO, 40 mg at 10/16/20 2204 .  hydrALAZINE (APRESOLINE) tablet 10 mg, 10 mg, Oral, Q8H PRN, Briant Cedar, MD, 10 mg at 10/15/20 1705   Patients Current Diet:  Diet Order                  Diet Heart Room service appropriate? Yes; Fluid consistency: Thin  Diet effective now                         Precautions / Restrictions Precautions Precautions: Fall Precaution Comments: vertigo Restrictions Weight Bearing Restrictions: No    Has the patient had 2 or more falls or a fall with injury in the past year?No   Prior Activity Level Community (5-7x/wk): Independent, driving and working fulltime   Prior Functional Level Prior Function Level of Independence: Independent   Self Care: Did the patient need help bathing, dressing, using the toilet or eating?  Independent   Indoor Mobility: Did the patient need assistance with walking from room to room (with or without device)?  Independent   Stairs: Did the patient need assistance with internal or external stairs (with or  without device)? Independent   Functional Cognition: Did the patient need help planning regular tasks such as shopping or remembering to take medications? Independent   Home Assistive Devices / Equipment Home Assistive Devices/Equipment: None Home Equipment: None   Prior Device Use: Indicate devices/aids used by the patient prior to current illness, exacerbation or injury? None of the above   Current Functional Level Cognition   Arousal/Alertness: Awake/alert Overall Cognitive Status: Within Functional Limits for tasks assessed Orientation Level: Oriented X4 Attention: Focused,Sustained Focused Attention: Appears intact Sustained Attention: Appears intact Memory: Appears intact Awareness: Appears intact Problem Solving: Appears intact Executive Function: Organizing,Sequencing Sequencing: Appears intact Safety/Judgment: Appears intact    Extremity Assessment (includes Sensation/Coordination)   Upper Extremity Assessment: Overall WFL for tasks assessed  Lower Extremity Assessment: Defer to PT evaluation     ADLs   Overall ADL's : Needs assistance/impaired Eating/Feeding: Independent,Sitting Grooming: Wash/dry hands,Wash/dry face,Min guard,Standing Upper Body Bathing: Set up,Supervision/ safety,Sitting Lower Body Bathing: Minimal assistance Upper Body Dressing : Set up,Supervision/safety,Sitting Lower Body Dressing: Minimal assistance Toilet Transfer: Ambulation,Minimal assistance,Cueing for safety Toileting- Clothing Manipulation and Hygiene: Min guard,Sit to/from stand Functional mobility during ADLs: Min guard     Mobility   Overal bed mobility: Needs Assistance Bed Mobility: Rolling,Sidelying to Sit Rolling: Supervision Sidelying to sit: Min assist (with rail) Supine to sit: Min guard Sit to supine: Min guard General bed mobility comments: rolled from right to left with incr vertigo; rested and then side to sit with rail and min assist to steady/balance. cues for  visual fixation and appropriate target     Transfers   Overall transfer level: Needs assistance Equipment used: Rolling walker (2 wheeled) Transfers: Sit to/from Stand Sit to Stand: Min assist General transfer comment: cued to use wide BOS and focus on object as come to stand; minimal sway noted on initial standing and left lean     Ambulation / Gait / Stairs / Wheelchair Mobility   Ambulation/Gait Ambulation/Gait assistance: Editor, commissioning (Feet): 55 Feet (seated rest; 15 ft) Assistive device: Rolling walker (2 wheeled) Gait Pattern/deviations: Step-through pattern,Decreased stride length,Wide base of support General Gait Details: very rigid, short steps. Cues for focusing on specific objects as he moved and turned (turn head, fixate on target, turn feet). Incresing left lean as he fatigued. Gait velocity: very slow Gait velocity interpretation: <1.31 ft/sec, indicative of household ambulator     Posture / Balance Balance Overall balance assessment: Needs assistance Sitting-balance support: No upper extremity supported,Feet supported Sitting balance-Leahy Scale: Fair Standing balance support: During functional activity,Bilateral upper extremity supported Standing balance-Leahy Scale: Poor     Special needs/care consideration Never Hospitalized before this admission HOH      Previous Home Environment  Living Arrangements: Spouse/significant other  Lives With: Spouse Available Help at Discharge: Available 24 hours/day Type of Home: House Home Layout: Two level,Bed/bath upstairs Alternate Level Stairs-Rails: Right Alternate Level Stairs-Number of Steps: 14 Home Access: Stairs to enter [employer] of Steps: 1 Bathroom Shower/Tub: Stage manager: Standard Bathroom Accessibility: Yes How Accessible: Accessible via walker Home Care Services: No   Discharge Living Setting Plans for Discharge Living Setting: Patient's  home,Lives with (comment) (wife) Type of Home at Discharge: House Discharge Home Layout: Two level,Bed/bath upstairs Alternate Level Stairs-Rails: Right Alternate Level Stairs-Number of Steps: 14 Discharge Home Access: Stairs to enter Entrance Stairs-Rails: None Entrance Stairs-Number of Steps: 1 Discharge  Bathroom Shower/Tub: Freight forwarderTub/shower unit,Walk-in shower Discharge Bathroom Toilet: Standard Discharge Bathroom Accessibility: Yes How Accessible: Accessible via walker Does the patient have any problems obtaining your medications?: No   Social/Family/Support Systems Patient Roles:  (employee) Contact Information: wife and daughter Anticipated Caregiver: wife and daughter Anticipated Industrial/product designerCaregiver's Contact Information: see above Ability/Limitations of Caregiver: wife bad back and uses a cane; daughter works until Sports administrator5 Caregiver Availability: 24/7 Discharge Plan Discussed with Primary Caregiver: Yes Is Caregiver In Agreement with Plan?: Yes Does Caregiver/Family have Issues with Lodging/Transportation while Pt is in Rehab?: No   Goals Patient/Family Goal for Rehab: Mod I to supervision with PT and OT Expected length of stay: ELos 7-11 days Additional Information: Patietn has never been hospitalized Pt/Family Agrees to Admission and willing to participate: Yes Program Orientation Provided & Reviewed with Pt/Caregiver Including Roles  & Responsibilities: Yes   Decrease burden of Care through IP rehab admission: n/a   Possible need for SNF placement upon discharge:not anticipated   Patient Condition: This patient's condition remains as documented in the consult dated 10/16/2020, in which the Rehabilitation Physician determined and documented that the patient's condition is appropriate for intensive rehabilitative care in an inpatient rehabilitation facility. Will admit to inpatient rehab today.   Preadmission Screen Completed By:  Clois DupesBoyette, [place] Godwin, RN, 10/17/2020 12:03  PM ______________________________________________________________________   Discussed status with Dr. Allena KatzPatel on 10/17/2020 at  1212 and received approval for admission today.   Admission Coordinator:  Clois DupesBoyette, Tyrihanna Wingert Godwin, time [id number] Date 10/17/2020           Revision History         Note Details  Author Marcello FennelPatel, Ankit Anil, MD File Time 10/17/2020 12:25 PM  Author Type Physician Status Addendum  Last Editor Marcello FennelPatel, Ankit Anil, MD Service Physical Medicine and [facility]  [facility] [id number] Admit Date 10/17/2020

## 2020-10-17 NOTE — Progress Notes (Signed)
Inpatient Rehabilitation  Patient information reviewed and entered into eRehab system by Taaliyah Delpriore M. Athina Fahey, M.A., CCC/SLP, PPS Coordinator.  Information including medical coding, functional ability and quality indicators will be reviewed and updated through discharge.    

## 2020-10-17 NOTE — PMR Pre-admission (Addendum)
PMR Admission Coordinator Pre-Admission Assessment  Patient: Charles Porter is an 85 y.o., male MRN: [id number] DOB: [birth year] Height: 5\' 6"  (167.6 cm) Weight: 62.9 kg              Insurance Information HMO:     PPO:      PCP:      IPA:      80/20:     OTHER:  PRIMARY: BCBS of      Policy#: [place]      Subscriber: pt CM Name: faxed approval      Phone#: [phone]   Fax#: [phone] Pre-Cert#: [id number] approved until 4/12 will need to use form provided to give concurrent reviews with the rehab tool. On 4/11    Employer: 6/[employer] Benefits:  Phone #: [phone]     Name: 4/5 Eff. Date: 02/12/2020     Deduct: $200      Out of Pocket Max: $500  CIR: 80%      SNF: 80% limited by medical neccesity Outpatient: 80%     Co-Pay: per medical neccesity for visits Home Health: 80%      Co-Pay: visits per medical neccesity DME: 80%     Co-Pay: 20% Providers: in network  SECONDARY: Medicare a and b      Policy#: [id number] A active 06/14/2003 and b 07/15/2007  Financial Counselor:       Phone#:   The "Data Collection Information Summary" for patients in Inpatient Rehabilitation Facilities with attached "Privacy Act Statement-Health Care Records" was provided and verbally reviewed with: Patient and Family  Emergency Contact Information Contact Information     Name Relation Home Work Mobile   Klepper,JANET S  [phone]     [phone] Daughter   [phone]      Current Medical History  Patient Admitting Diagnosis: CVA  History of Present Illness: 85 year old male in relatively good health but no medical care for years; who was admitted on 10/13/20 with 4 day history of dizziness with off balance feeling and 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 He was treated with hydralazine with significant drop and onset of dysarthria and left facial droop. MRI brain done revealing patchy small volume acute infarcts left > right cerebellar hemispheres and moderate chronic  microvascular disease. 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 Incidental note of 1.2 cm thyroid nodule and 4 mm LLL nodule. 2D echo with EF 60-65% and severe concentric LVH with grade 1 DD.  Dr. 12/13/20 felt that stroke likely embolic due to VA stenosis/occlusion but need to rule out cardioembolic source. SBP goal 140-150 range given b/l VA stenosis and outpatient follow up with [facility].    Neurology recommended ASA 325 mg/Plavix X 3 months followed by ASA alone.  30 day cardiac event monitor recommended at discharge--to contact cardiology prior to d/c. Patient continues to be limited by dizziness with balance deficits as well as significant elevations in BP with activity. Today reporting increase in nausea felt to be due to constipation   Past Medical History  History reviewed. No pertinent past medical history.  Family History  family history is not on file.  Prior Rehab/Hospitalizations:  Has the patient had prior rehab or hospitalizations prior to admission? Yes  Has the patient had major surgery during 100 days prior to admission? No  Current Medications   Current Facility-Administered Medications:     stroke: mapping  our early stages of recovery book, , Does not apply, Once, Julian Reil, Jared M, DO   acetaminophen (TYLENOL) tablet 650 mg, 650 mg, Oral, Q4H PRN, 650 mg at 10/14/20 0042 **OR** acetaminophen (TYLENOL) 160 MG/5ML solution 650 mg, 650 mg, Per Tube, Q4H PRN **OR** acetaminophen (TYLENOL) suppository 650 mg, 650 mg, Rectal, Q4H PRN, Julian Reil, Jared M, DO   amLODipine (NORVASC) tablet 10 mg, 10 mg, Oral, Daily, Marvel Plan, MD, 10 mg at 10/17/20 3716   [DISCONTINUED] aspirin suppository 300 mg, 300 mg, Rectal, Daily **OR** aspirin tablet 325 mg, 325 mg, Oral, Daily, Julian Reil, Jared M, DO, 325 mg at 10/17/20 0855   atorvastatin (LIPITOR) tablet 80 mg, 80 mg, Oral,  Daily, Briant Cedar, MD, 80 mg at 10/17/20 0854   clopidogrel (PLAVIX) tablet 75 mg, 75 mg, Oral, Daily, Marvel Plan, MD, 75 mg at 10/17/20 0855   docusate sodium (COLACE) capsule 100 mg, 100 mg, Oral, BID, Regalado, Belkys A, MD, 100 mg at 10/17/20 1202   enoxaparin (LOVENOX) injection 40 mg, 40 mg, Subcutaneous, QHS, Gardner, Jared M, DO, 40 mg at 10/16/20 2204   hydrALAZINE (APRESOLINE) tablet 10 mg, 10 mg, Oral, Q8H PRN, Briant Cedar, MD, 10 mg at 10/15/20 1705  Patients Current Diet:  Diet Order             Diet Heart Room service appropriate? Yes; Fluid consistency: Thin  Diet effective now                   Precautions / Restrictions Precautions Precautions: Fall Precaution Comments: vertigo Restrictions Weight Bearing Restrictions: No   Has the patient had 2 or more falls or a fall with injury in the past year?No  Prior Activity Level Community (5-7x/wk): Independent, driving and working fulltime  Prior Functional Level Prior Function Level of Independence: Independent  Self Care: Did the patient need help bathing, dressing, using the toilet or eating?  Independent  Indoor Mobility: Did the patient need assistance with walking from room to room (with or without device)? Independent  Stairs: Did the patient need assistance with internal or external stairs (with or without device)? Independent  Functional Cognition: Did the patient need help planning regular tasks such as shopping or remembering to take medications? Independent  Home Assistive Devices / Equipment Home Assistive Devices/Equipment: None Home Equipment: None  Prior Device Use: Indicate devices/aids used by the patient prior to current illness, exacerbation or injury? None of the above  Current Functional Level Cognition  Arousal/Alertness: Awake/alert Overall Cognitive Status: Within Functional Limits for tasks assessed Orientation Level: Oriented X4 Attention:  Focused,Sustained Focused Attention: Appears intact Sustained Attention: Appears intact Memory: Appears intact Awareness: Appears intact Problem Solving: Appears intact Executive Function: Organizing,Sequencing Sequencing: Appears intact Safety/Judgment: Appears intact    Extremity Assessment (includes Sensation/Coordination)  Upper Extremity Assessment: Overall WFL for tasks assessed  Lower Extremity Assessment: Defer to PT evaluation    ADLs  Overall ADL's : Needs assistance/impaired Eating/Feeding: Independent,Sitting Grooming: Wash/dry hands,Wash/dry face,Min guard,Standing Upper Body Bathing: Set up,Supervision/ safety,Sitting Lower Body Bathing: Minimal assistance Upper Body Dressing : Set up,Supervision/safety,Sitting Lower Body Dressing: Minimal assistance Toilet Transfer: Ambulation,Minimal assistance,Cueing for safety Toileting- Clothing Manipulation and Hygiene: Min guard,Sit to/from stand Functional mobility during ADLs: Min guard    Mobility  Overal bed mobility: Needs Assistance Bed Mobility: Rolling,Sidelying to Sit Rolling: Supervision Sidelying to sit: Min assist (with rail) Supine to sit: Min guard Sit to supine: Min guard General bed mobility comments: rolled from right to left  with incr vertigo; rested and then side to sit with rail and min assist to steady/balance. cues for visual fixation and appropriate target    Transfers  Overall transfer level: Needs assistance Equipment used: Rolling walker (2 wheeled) Transfers: Sit to/from Stand Sit to Stand: Min assist General transfer comment: cued to use wide BOS and focus on object as come to stand; minimal sway noted on initial standing and left lean    Ambulation / Gait / Stairs / Wheelchair Mobility  Ambulation/Gait Ambulation/Gait assistance: Editor, commissioning (Feet): 55 Feet (seated rest; 15 ft) Assistive device: Rolling walker (2 wheeled) Gait Pattern/deviations: Step-through  pattern,Decreased stride length,Wide base of support General Gait Details: very rigid, short steps. Cues for focusing on specific objects as he moved and turned (turn head, fixate on target, turn feet). Incresing left lean as he fatigued. Gait velocity: very slow Gait velocity interpretation: <1.31 ft/sec, indicative of household ambulator    Posture / Balance Balance Overall balance assessment: Needs assistance Sitting-balance support: No upper extremity supported,Feet supported Sitting balance-Leahy Scale: Fair Standing balance support: During functional activity,Bilateral upper extremity supported Standing balance-Leahy Scale: Poor    Special needs/care consideration Never Hospitalized before this admission HOH    Previous Home Environment  Living Arrangements: Spouse/significant other  Lives With: Spouse Available Help at Discharge: Available 24 hours/day Type of Home: House Home Layout: Two level,Bed/bath upstairs Alternate Level Stairs-Rails: Right Alternate Level Stairs-Number of Steps: 14 Home Access: Stairs to enter [employer] of Steps: 1 Bathroom Shower/Tub: Stage manager: Standard Bathroom Accessibility: Yes How Accessible: Accessible via walker Home Care Services: No  Discharge Living Setting Plans for Discharge Living Setting: Patient's home,Lives with (comment) (wife) Type of Home at Discharge: House Discharge Home Layout: Two level,Bed/bath upstairs Alternate Level Stairs-Rails: Right Alternate Level Stairs-Number of Steps: 14 Discharge Home Access: Stairs to enter Entrance Stairs-Rails: None Entrance Stairs-Number of Steps: 1 Discharge Bathroom Shower/Tub: Freight forwarder: Standard Discharge Bathroom Accessibility: Yes How Accessible: Accessible via walker Does the patient have any problems obtaining your medications?: No  Social/Family/Support Systems Patient Roles:   (employee) Contact Information: wife and daughter Anticipated Caregiver: wife and daughter Anticipated Industrial/product designer Information: see above Ability/Limitations of Caregiver: wife bad back and uses a cane; daughter works until Sports administrator Availability: 24/7 Discharge Plan Discussed with Primary Caregiver: Yes Is Caregiver In Agreement with Plan?: Yes Does Caregiver/Family have Issues with Lodging/Transportation while Pt is in Rehab?: No  Goals Patient/Family Goal for Rehab: Mod I to supervision with PT and OT Expected length of stay: ELos 7-11 days Additional Information: Patietn has never been hospitalized Pt/Family Agrees to Admission and willing to participate: Yes Program Orientation Provided & Reviewed with Pt/Caregiver Including Roles  & Responsibilities: Yes  Decrease burden of Care through IP rehab admission: n/a  Possible need for SNF placement upon discharge:not anticipated  Patient Condition: This patient's condition remains as documented in the consult dated 10/16/2020, in which the Rehabilitation Physician determined and documented that the patient's condition is appropriate for intensive rehabilitative care in an inpatient rehabilitation facility. Will admit to inpatient rehab today.  Preadmission Screen Completed By:  Clois Dupes, RN, 10/17/2020 12:03 PM ______________________________________________________________________   Discussed status with Dr. Allena Katz on 10/17/2020 at  1212 and received approval for admission today.  Admission Coordinator:  Clois Dupes, time 2694 Date 10/17/2020

## 2020-10-18 DIAGNOSIS — I639 Cerebral infarction, unspecified: Secondary | ICD-10-CM

## 2020-10-18 DIAGNOSIS — R7401 Elevation of levels of liver transaminase levels: Secondary | ICD-10-CM

## 2020-10-18 DIAGNOSIS — I10 Essential (primary) hypertension: Secondary | ICD-10-CM

## 2020-10-18 DIAGNOSIS — K5901 Slow transit constipation: Secondary | ICD-10-CM

## 2020-10-18 DIAGNOSIS — E785 Hyperlipidemia, unspecified: Secondary | ICD-10-CM

## 2020-10-18 LAB — COMPREHENSIVE METABOLIC PANEL
ALT: 37 U/L (ref 0–44)
AST: 49 U/L — ABNORMAL HIGH (ref 15–41)
Albumin: 3.4 g/dL — ABNORMAL LOW (ref 3.5–5.0)
Alkaline Phosphatase: 69 U/L (ref 38–126)
Anion gap: 8 (ref 5–15)
BUN: 21 mg/dL (ref 8–23)
CO2: 26 mmol/L (ref 22–32)
Calcium: 9.1 mg/dL (ref 8.9–10.3)
Chloride: 105 mmol/L (ref 98–111)
Creatinine, Ser: 1.13 mg/dL (ref 0.61–1.24)
GFR, Estimated: >60 mL/min (ref >60)
Glucose, Bld: 111 mg/dL — ABNORMAL HIGH (ref 70–99)
Potassium: 3.7 mmol/L (ref 3.5–5.1)
Sodium: 139 mmol/L (ref 135–145)
Total Bilirubin: 1 mg/dL (ref 0.3–1.2)
Total Protein: 7.1 g/dL (ref 6.5–8.1)

## 2020-10-18 LAB — CBC WITH DIFFERENTIAL/PLATELET
Abs Immature Granulocytes: 0.01 10*3/uL (ref 0.00–0.07)
Basophils Absolute: 0 10*3/uL (ref 0.0–0.1)
Basophils Relative: 1 %
Eosinophils Absolute: 0.1 10*3/uL (ref 0.0–0.5)
Eosinophils Relative: 1 %
HCT: 45.2 % (ref 39.0–52.0)
Hemoglobin: 15.4 g/dL (ref 13.0–17.0)
Immature Granulocytes: 0 %
Lymphocytes Relative: 50 %
Lymphs Abs: 2.8 10*3/uL (ref 0.7–4.0)
MCH: 31.5 pg (ref 26.0–34.0)
MCHC: 34.1 g/dL (ref 30.0–36.0)
MCV: 92.4 fL (ref 80.0–100.0)
Monocytes Absolute: 0.6 10*3/uL (ref 0.1–1.0)
Monocytes Relative: 10 %
Neutro Abs: 2.1 10*3/uL (ref 1.7–7.7)
Neutrophils Relative %: 38 %
Platelets: 314 10*3/uL (ref 150–400)
RBC: 4.89 MIL/uL (ref 4.22–5.81)
RDW: 13 % (ref 11.5–15.5)
WBC: 5.6 10*3/uL (ref 4.0–10.5)
nRBC: 0 % (ref 0.0–0.2)

## 2020-10-18 NOTE — Progress Notes (Signed)
Occupational Therapy Session Note  Patient Details  Name: Charles Porter MRN: [id number] Date of Birth: 03/16/1936  Today's Date: 10/18/2020 OT Individual Time: 1300-1330 OT Individual Time Calculation (min): 30 min    Short Term Goals: Week 1:  OT Short Term Goal 1 (Week 1): STG =LTG dt ELOS  Skilled Therapeutic Interventions/Progress Updates:    Patient in bed, notes that he feels a little better but does not want to eat after vomiting this am.  He notes that he is awaiting a suppository due to not having a recent BM.  He agrees to sitting edge of bed for ongoing coordination and vision assessments.  Supine to sitting with CS.  Good seated balance and upright posture.  Visual motor, fields, depth all grossly intact - he denies diplopia or change to vision.   Completed coordination assessments: Box and blocks:  R = 51, L = 48 9 Hole peg:  R = 26 sec, L = 26 sec He returned to lying position due to nausea with CS.  He is pleasant, cooperative and eager to do more mobility as N/V resolve.  Bed alarm set and call bell in hand.    Therapy Documentation Precautions:  Precautions Precautions: Fall Precaution Comments: vertigo Restrictions Weight Bearing Restrictions: No  Therapy/Group: Individual Therapy  Barrie Lyme 10/18/2020, 3:14 PM

## 2020-10-18 NOTE — IPOC Note (Signed)
Individualized overall Plan of Care [facility]) Patient Details Name: Charles Porter MRN: [id number] DOB: July 08, 1936  Admitting Diagnosis: Cerebellar stroke [facility])  Hospital Problems: Principal Problem:   Cerebellar stroke (HCC) Active Problems:   Transaminitis     Functional Problem List: Nursing Bowel,Edema,Endurance,Medication Management,Perception,Safety  PT Balance,Endurance,Motor,Sensory,Safety  OT Balance,Endurance,Motor,Safety,Vision  SLP    TR         Basic ADL's: OT Grooming,Bathing,Dressing,Toileting     Advanced  ADL's: OT       Transfers: PT Bed Mobility,Bed to Chair,Car,Furniture  OT Toilet,Tub/Shower     Locomotion: PT Ambulation,Stairs     Additional Impairments: OT    SLP        TR      Anticipated Outcomes Item Anticipated Outcome  Self Feeding MOD I  Swallowing      Basic self-care  S  Toileting  S   Bathroom Transfers S  Bowel/Bladder  Mod I and regular pattern of bowel movements  Transfers  Supervision  Locomotion  Supervision  Communication     Cognition     Pain     Safety/Judgment  mod I   Therapy Plan: PT Intensity: Minimum of 1-2 x/day ,45 to 90 minutes PT Frequency: 5 out of 7 days PT Duration Estimated Length of Stay: 7-10 OT Intensity: Minimum of 1-2 x/day, 45 to 90 minutes OT Frequency: 5 out of 7 days OT Duration/Estimated Length of Stay: 7-10      Team Interventions: Nursing Interventions Patient/Family Education,Bowel Management,Disease Management/Prevention,Medication Management,Discharge Planning,Psychosocial Support  PT interventions Ambulation/gait training,Functional mobility training,Discharge planning,DME/adaptive equipment instruction,Balance/vestibular training,Disease management/prevention,Neuromuscular re-education,Patient/family education,Stair training,Therapeutic Exercise,UE/LE Strength taining/ROM,Therapeutic Activities  OT Interventions Balance/vestibular training,Discharge planning,Pain  management,Self Care/advanced ADL retraining,Therapeutic Activities,UE/LE Coordination activities,Disease mangement/prevention,Functional mobility training,Patient/family education,Skin care/wound managment,Therapeutic Exercise,Visual/perceptual remediation/compensation,Community reintegration,DME/adaptive equipment instruction,Neuromuscular re-education,Psychosocial support,UE/LE Strength taining/ROM  SLP Interventions    TR Interventions    SW/CM Interventions Discharge Planning,Psychosocial Support,Patient/Family Education   Barriers to Discharge MD  Medical stability  Nursing Incontinence,Lack of/limited family support,Medication compliance Constipation  PT      OT Decreased caregiver support    SLP      SW Decreased caregiver support Wife's back issues can not assist   Team Discharge Planning: Destination: PT-Home ,OT- Home , SLP-  Projected Follow-up: PT-Home health PT, OT-  Home health OT, SLP-  Projected Equipment Needs: PT-Rolling walker with 5" wheels, OT- 3 in 1 bedside comode,Tub/shower bench, SLP-  Equipment Details: PT- , OT-  Patient/family involved in discharge planning: PT- Patient,  OT-Patient, SLP-   MD ELOS: 7-10 days. Medical Rehab Prognosis:  Good Assessment: Charles Porter is an 85 year old male in relatively good health but no medical care for years; who was admitted on 10/13/2020 with 4-day history of dizziness with feeling of off balance and found to have malignant hypertension with SBP- 240's.  History taken from chart review and patient. He was treated with hydralazine with significant drop and onset of dysarthria and left facial droop. 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 Incidental note of 1.2 cm  thyroid nodule and 4 mm LLL nodule.  Echocardiogram with ejection fraction of 60 to 65%, severe concentric LVH with grade 1 DD.  Dr. Roda Shutters felt that stroke likely embolic due to VA stenosis/occlusion but need to rule out cardioembolic source.  SBP goal 140-150 range given b/l VA stenosis and outpatient follow up with [facility].  Neurology recommended ASA 325 mg/Plavix X 3 months followed by ASA alone.  30 day cardiac event monitor recommended at discharge--to contact cardiology prior to d/c. Patient continues to be limited by dizziness with balance deficits as well as significant elevations in BP with activity. Nausea felt to be due to constipation. Patient with resulting functional deficits with balance, endurance.  Will set goals for Supervision with PT/OT.  Due to the current state of emergency, patients may not be receiving their 3-hours of Medicare-mandated therapy.  See Team Conference Notes for weekly updates to the plan of care

## 2020-10-18 NOTE — Progress Notes (Signed)
Pam PA notified of pt emesis and nausea with movement with AM. No new orders. Continue POC and advised to utilize PRN antinausea meds. Mylo Red, LPN

## 2020-10-18 NOTE — Evaluation (Signed)
Physical Therapy Assessment and Plan  Patient Details  Name: Charles Porter MRN: [id number] Date of Birth: 08-07-1935  PT Diagnosis: Abnormality of gait, Difficulty walking, Dizziness and giddiness and Vertigo of central origin Rehab Potential: Good ELOS: 7-10   Today's Date: 10/18/2020 PT Individual Time: 0900-1000 PT Individual Time Calculation (min): 60 min    Hospital Problem: Principal Problem:   Cerebellar stroke [facility])   Past Medical History: History reviewed. No pertinent past medical history. Past Surgical History: History reviewed. No pertinent surgical history.  Assessment & Plan Clinical Impression:  Charles Porter is an 85 year old male in relatively good health but no medical care for years; who was admitted on 10/13/2020 with 4-day history of dizziness with feeling of off balance and found to have malignant hypertension with SBP- 240's.  History taken from chart review and patient. He was treated with hydralazine with significant drop and onset of dysarthria and left facial droop. 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 Incidental note of 1.2 cm thyroid nodule and 4 mm LLL nodule.  Echocardiogram with ejection fraction of 60 to 65%, severe concentric LVH with grade 1 DD.  Dr. Erlinda Hong felt that stroke likely embolic due to VA stenosis/occlusion but need to rule out cardioembolic source. SBP goal 140-150 range given b/l VA stenosis and outpatient follow up with [facility].   Neurology recommended ASA 325 mg/Plavix X 3 months followed by ASA alone.  30 day cardiac event monitor recommended at discharge--to contact cardiology prior to d/c. Patient continues to be limited by dizziness with balance deficits as well as significant  elevations in BP with activity. Today reporting increase in nausea felt to be due to constipation. CIR recommended due to functional decline.Patient transferred to CIR on 10/17/2020 .   Patient currently requires min with mobility secondary to decreased motor planning and central origin and peripheral.  Prior to hospitalization, patient was independent  with mobility and lived with Spouse in a House home.  Home access is 1Stairs to enter.  Patient will benefit from skilled PT intervention to maximize safe functional mobility, minimize fall risk and decrease caregiver burden for planned discharge home with 24 hour supervision.  Anticipate patient will benefit from follow up [place] at discharge.  PT - End of Session Activity Tolerance: Tolerates 30+ min activity with multiple rests Endurance Deficit: Yes Endurance Deficit Description: limited by nausea/dizziness PT Assessment Rehab Potential (ACUTE/IP ONLY): Good PT Patient demonstrates impairments in the following area(s): Balance;Endurance;Motor;Sensory;Safety PT Transfers Functional Problem(s): Bed Mobility;Bed to Chair;Car;Furniture PT Locomotion Functional Problem(s): Ambulation;Stairs PT Plan PT Intensity: Minimum of 1-2 x/day ,45 to 90 minutes PT Frequency: 5 out of 7 days PT Duration Estimated Length of Stay: 7-10 PT Treatment/Interventions: Ambulation/gait training;Functional mobility training;Discharge planning;DME/adaptive equipment instruction;Balance/vestibular training;Disease management/prevention;Neuromuscular re-education;Patient/family education;Stair training;Therapeutic Exercise;UE/LE Strength taining/ROM;Therapeutic Activities PT Transfers Anticipated Outcome(s): Supervision PT Locomotion Anticipated Outcome(s): Supervision PT Recommendation Follow Up Recommendations: Home health PT Patient destination: Home Equipment Recommended: Rolling walker with 5" wheels   PT  Evaluation Precautions/Restrictions Precautions Precautions: Fall Precaution Comments: vertigo General   Vital Signs Pain Pain Assessment Pain Scale: 0-10 Pain Score: 0-No pain Pain Intervention(s): Medication (See eMAR) Home Living/Prior Functioning Home Living Available Help at Discharge: Available 24 hours/day Type of Home: House Home Access: Stairs to enter [employer]  of Steps: 1 Home Layout: Two level;Bed/bath upstairs Alternate Level Stairs-Number of Steps: 14 Alternate Level Stairs-Rails: Right Bathroom Shower/Tub: Tub/shower unit;Walk-in shower;Curtain Bathroom Toilet: Standard Bathroom Accessibility: Yes  Lives With: Spouse Prior Function Level of Independence: Independent with basic ADLs;Independent with homemaking with ambulation  Able to Take Stairs?: Yes Driving: Yes Vocation: Full time employment Vocation Requirements: works Astronomer at [employer]: Hobbies-yes (Comment) Comments: bowling Vision/Perception  Perception Perception: Within Functional Limits Praxis Praxis: Impaired Praxis Impairment Details: Motor planning  Cognition Overall Cognitive Status: Within Functional Limits for tasks assessed Arousal/Alertness: Awake/alert Orientation Level: Oriented X4 Sustained Attention: Appears intact Memory: Appears intact Immediate Memory Recall: Sock;Blue;Bed Memory Recall Sock: Without Cue Memory Recall Bed: Without Cue Awareness: Appears intact Sequencing: Appears intact Safety/Judgment: Appears intact Sensation Sensation Light Touch: Appears Intact Coordination Gross Motor Movements are Fluid and Coordinated: Yes Fine Motor Movements are Fluid and Coordinated: No Motor  Motor Motor: Within Functional Limits Motor - Skilled Clinical Observations: patient reports transient R sided weakness, but not noted on eval   Trunk/Postural Assessment  Cervical Assessment Cervical Assessment: Exceptions to [facility] (forward  head) Thoracic Assessment Thoracic Assessment: Exceptions to [facility] (rounded shoulders) Lumbar Assessment Lumbar Assessment: Exceptions to [facility] (posterior tilt) Postural Control Postural Control: Within Functional Limits  Balance Balance Balance Assessed: Yes Dynamic Sitting Balance Dynamic Sitting - Level of Assistance: 4: Min assist;5: Stand by assistance Dynamic Sitting Balance - Compensations: fatigue-L lean Dynamic Standing Balance Dynamic Standing - Level of Assistance: 4: Min assist Extremity Assessment  RUE Assessment RUE Assessment: Within Functional Limits LUE Assessment LUE Assessment: Exceptions to [facility] General Strength Comments: slightly weaker than RUE RLE Assessment RLE Assessment: Within Functional Limits LLE Assessment LLE Assessment: Within Functional Limits  Care Tool Care Tool Bed Mobility Roll left and right activity        Sit to lying activity        Lying to sitting edge of bed activity   Lying to sitting edge of bed assist level: Minimal Assistance - Patient > 75%     Care Tool Transfers Sit to stand transfer   Sit to stand assist level: Minimal Assistance - Patient > 75%    Chair/bed transfer   Chair/bed transfer assist level: Minimal Assistance - Patient > 75%     Toilet transfer   Assist Level: Minimal Assistance - Patient > 75%    Car transfer Car transfer activity did not occur: Safety/medical concerns        Care Tool Locomotion Ambulation   Assist level: Minimal Assistance - Patient > 75% Assistive device: Walker-rolling Max distance: 50'  Walk 10 feet activity   Assist level: Minimal Assistance - Patient > 75%     Walk 50 feet with 2 turns activity   Assist level: Minimal Assistance - Patient > 75% Assistive device: Walker-rolling  Walk 150 feet activity Walk 150 feet activity did not occur: Safety/medical concerns      Walk 10 feet on uneven surfaces activity Walk 10 feet on uneven surfaces activity did not occur:  Safety/medical concerns      Stairs   Assist level: Minimal Assistance - Patient > 75% Stairs assistive device: 2 hand rails Max number of stairs: 8 (3" Steps)  Walk up/down 1 step activity   Walk up/down 1 step (curb) assist level: Minimal Assistance - Patient > 75% Walk up/down 1 step or curb assistive device: 2 hand rails    Walk up/down 4 steps activity Walk up/down 4 steps assist level: Minimal  Assistance - Patient > 75% Walk up/down 4 steps assistive device: 2 hand rails  Walk up/down 12 steps activity Walk up/down 12 steps activity did not occur: Safety/medical concerns      Pick up small objects from floor Pick up small object from the floor (from standing position) activity did not occur: Safety/medical concerns      Wheelchair Will patient use wheelchair at discharge?: No Type of Wheelchair: Manual   Wheelchair assist level: Supervision/Verbal cueing Max wheelchair distance: 67'  Wheel 50 feet with 2 turns activity   Assist Level: Supervision/Verbal cueing  Wheel 150 feet activity Wheelchair 150 feet activity did not occur: Safety/medical concerns      Refer to Care Plan for [place] 1 PT Short Term Goal 1 (Week 1): N/A due to ELOS  Recommendations for other services: None   Skilled Therapeutic Intervention Patient in supine and reports was able to get a shower this morning and feeling better.  Performed orthostatic BP as noted below.   Orthostatic VS for the past 24 hrs (Last 3 readings):  BP- Lying Pulse- Lying BP- Sitting Pulse- Sitting BP- Standing at 0 minutes Pulse- Standing at 0 minutes  10/18/20 0910 162/77 63 141/84 82 158/83 85  Patient supine to sit with S and min A for sit to stand, but became nauseated standing for BP measurement.  Seated EOB and vomited.  RN made aware and pt settled while sitting EOB and returned to supine.  Applied knee hi TEDS for BP management per RN and she delivered regular medications.  Patient felt  some better so supine to sit with S.  Seated for initiating vestibular assessment as noted below.  Patient not able to attempt sit to stand or ambulation due to continued nausea.  Patient returned to supine and left with call bell in reach and bed alarm active.  Second note for second session with more mobility assessment as noted below.  Vestibular Assessment - 10/18/20 0001      Symptom Behavior   Subjective history of current problem began last Wedesday with spinning, nausea, off balance.  Reports cleared some on Wednesday, but worse Thursday morning and had to call out of work.  States fell in kitchen between trash can and cabinet, but eventually able to return to upright.  States still with symptoms Friday and Daughter took to urgent care Saturday.  States had intermittent difficulty lifting R hand, but got better.  Once at urgent care and getting IV medicaiton for BP felt more symptoms with slurred speech, etc.  Now symptoms are spinning looking R and Wooziness    Type of Dizziness  Imbalance;Spinning;Comment   woozy   Frequency of Dizziness Intermittent    Duration of Dizziness minutes    Symptom Nature Motion provoked;Variable;Intermittent    Aggravating Factors Activity in general;Supine to sit;Sit to stand    Relieving Factors Head stationary;Lying supine;Closing eyes;Rest;Slow movements    Progression of Symptoms Better    History of similar episodes Yes, reports had similar spinning couple of years ago, but got better with rest and BC, but also had some small exacerbations, but "I knew what to do about it".      Oculomotor Exam   Oculomotor Alignment Normal   but head tipped to L with shoulder elevation   Ocular ROM WFL    Spontaneous Absent    Gaze-induced  Right beating nystagmus with R gaze    Smooth Pursuits Saccades   when moving  eyes to R   Saccades Hypometric;Undershoots      Oculomotor Exam-Fixation Suppressed    Left Head Impulse positive for refixation    Right Head  Impulse negative          Mobility Bed Mobility Bed Mobility: Sit to Supine;Supine to Sit Supine to Sit: Supervision/Verbal cueing Sit to Supine: Minimal Assistance - Patient > 75%;Supervision/Verbal cueing Transfers Transfers: Sit to Stand;Stand to Sit Sit to Stand: Minimal Assistance - Patient > 75% Stand to Sit: Minimal Assistance - Patient > 75% Transfer (Assistive device): Rolling walker Locomotion  Gait Ambulation: Yes Gait Assistance: Minimal Assistance - Patient > 75% Gait Distance (Feet): 50 Feet Assistive device: Rolling walker Gait Assistance Details: Verbal cues for safe use of DME/AE;Verbal cues for precautions/safety;Verbal cues for technique Gait Assistance Details: cues for visual target to reduce symptoms of dizziness Gait Gait: Yes Gait Pattern: Impaired Gait Pattern: Step-to pattern;Decreased stride length;Shuffle Stairs / Additional Locomotion Stairs: Yes Stairs Assistance: Minimal Assistance - Patient > 75% Stair Management Technique: Two rails;Forwards;Step to pattern Number of Stairs: 8 Height of Stairs: 3 Wheelchair Mobility Wheelchair Mobility: Yes Wheelchair Assistance: Chartered loss adjuster: Both upper extremities Wheelchair Parts Management: Supervision/cueing Distance: 28'   Discharge Criteria: Patient will be discharged from PT if patient refuses treatment 3 consecutive times without medical reason, if treatment goals not met, if there is a change in medical status, if patient makes no progress towards goals or if patient is discharged from hospital.  The above assessment, treatment plan, treatment alternatives and goals were discussed and mutually agreed upon: by patient  Jamison Oka, PT 10/18/2020, 12:50 PM

## 2020-10-18 NOTE — Progress Notes (Signed)
Physical Therapy Session Note  Patient Details  Name: Charles Porter MRN: [id number] Date of Birth: 1936-06-26  Today's Date: 10/18/2020 PT Individual Time: 1515-1600 PT Individual Time Calculation (min): 45 min   Short Term Goals: Week 1:  PT Short Term Goal 1 (Week 1): N/A due to ELOS  Skilled Therapeutic Interventions/Progress Updates:    Patient in supine asleep, but easily aroused.  Reports did not eat lunch due to loss of appetite.  Patient supine to sit with time and S with cues for visualization.  Seated EOB no noted nystagmus.  Performed pivot to w/c with min A and cues.  Propelled w/c x 26' with S and increased time with cues for targeting to reduce symptoms of dizziness.  Patient assisted to therapy gym in w/c.  Sit to stand to RW with min A and ambulated x 50' with min A.  Seated rest, then negotiated 8 (3") steps with rails and min A step to pattern.  Patient c/o nausea and vomited seated EOM.  Assisted with cool cloth and fanning and pt's symptoms controlled.  Education throughout about symptoms and possible causes with potential prior existing hypofunction on L worsened by cerebellar stroke and that hopeful for full recovery but that some people still feel symptoms in certain situations.  Patient assisted to w/c via stand pivot.  Pushed in w/c to room and set up recliner and pt transferred to recliner min A.  Left seated with call bell and needs in reach and chair alarm active.  RN made aware pt with N&V again in session and she reports pt did NOT have nausea medication today.  Discussed hopeful to have prior to therapies.    Therapy Documentation Precautions:  Precautions Precautions: Fall Precaution Comments: vertigo Restrictions Weight Bearing Restrictions: No    Therapy/Group: Individual Therapy  Charles Porter  [name], PT 10/18/2020, 6:10 PM

## 2020-10-18 NOTE — Progress Notes (Signed)
Blood pressure and exercise book given to pt. Pt educated on materials. Pt questions answered.  Mylo Red, LPN

## 2020-10-18 NOTE — Progress Notes (Signed)
Charles Porter PHYSICAL MEDICINE & REHABILITATION PROGRESS NOTE  Subjective/Complaints: Patient seen sitting up in his chair working with therapy this morning.  He states he slept well overnight.  He has questions regarding etiology of stroke.  Discussed causes, emphasized importance of medication compliance.  Discussed blood pressure with therapies.  ROS: Denies CP, SOB, N/V/D  Objective: Vital Signs: Blood pressure (!) 154/97, pulse 88, temperature 98.4 F (36.9 C), temperature source Oral, resp. rate 20, height 5\' 6"  (1.676 m), weight 61.9 kg, SpO2 100 %. No results found. Recent Labs    10/17/20 0300 10/18/20 0543  WBC 7.0 5.6  HGB 15.1 15.4  HCT 45.1 45.2  PLT 293 314   Recent Labs    10/17/20 0300 10/18/20 0543  NA 138 139  K 3.8 3.7  CL 105 105  CO2 27 26  GLUCOSE 96 111*  BUN 22 21  CREATININE 1.16 1.13  CALCIUM 9.1 9.1    Intake/Output Summary (Last 24 hours) at 10/18/2020 1307 Last data filed at 10/18/2020 0730 Gross per 24 hour  Intake 118 ml  Output 100 ml  Net 18 ml        Physical Exam: BP (!) 154/97 (BP Location: Right Arm)   Pulse 88   Temp 98.4 F (36.9 C) (Oral)   Resp 20   Ht 5\' 6"  (1.676 m)   Wt 61.9 kg   SpO2 100%   BMI 22.03 kg/m  Constitutional: No distress . Vital signs reviewed. HENT: Normocephalic.  Atraumatic. Eyes: EOMI. No discharge. Cardiovascular: No JVD.  RRR. Respiratory: Normal effort.  No stridor.  Bilateral clear to auscultation. GI: Non-distended.  BS +. Skin: Warm and dry.  Intact. Psych: Normal mood.  Normal behavior. Musc: No edema in extremities.  No tenderness in extremities. Neuro: Alert HOH  Motor: 5/5 throughout, unchanged  Assessment/Plan: 1. Functional deficits which require 3+ hours per day of interdisciplinary therapy in a comprehensive inpatient rehab setting.  Physiatrist is providing close team supervision and 24 hour management of active medical problems listed below.  Physiatrist and rehab team  continue to assess barriers to discharge/monitor patient progress toward functional and medical goals   Care Tool:  Bathing    Body parts bathed by patient: Right arm,Left arm,Chest,Abdomen,Front perineal area,Buttocks,Right upper leg,Left upper leg,Right lower leg,Left lower leg,Face         Bathing assist Assist Level: Minimal Assistance - Patient > 75%     Upper Body Dressing/Undressing Upper body dressing   What is the patient wearing?: Pull over shirt    Upper body assist Assist Level: Set up assist    Lower Body Dressing/Undressing Lower body dressing      What is the patient wearing?: Pants     Lower body assist Assist for lower body dressing: Minimal Assistance - Patient > 75%     Toileting Toileting    Toileting assist Assist for toileting: Minimal Assistance - Patient > 75%     Transfers Chair/bed transfer  Transfers assist     Chair/bed transfer assist level: Minimal Assistance - Patient > 75%     Locomotion Ambulation   Ambulation assist              Walk 10 feet activity   Assist           Walk 50 feet activity   Assist           Walk 150 feet activity   Assist  Walk 10 feet on uneven surface  activity   Assist           Wheelchair     Assist               Wheelchair 50 feet with 2 turns activity    Assist            Wheelchair 150 feet activity     Assist           Medical Problem List and Plan: 1.  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  Begin CIR evaluations  2.  Antithrombotics: -DVT/anticoagulation:  Pharmaceutical: Lovenox  CBC within normal limits on 4/7             -antiplatelet therapy: DAPT 3. Pain Management:  N/A 4. Mood: LCSW to follow up for evaluation and support.              -antipsychotic agents: N/A 5. Neuropsych: This  patient is capable of making decisions on his own behalf. 6. Skin/Wound Care: Routine pressure relief measures  7. Fluids/Electrolytes/Nutrition: Monitor I/Os 8. HTN: Monitor BP tid             Norvasc             Monitor with increased mobility 9.  Dyslipidemia: Lipitor 10. Vestibular symptoms: May need to pre-medicate prior to activity.             --Added scopolamine patch              --Vestibular eval  11. Slow transit constipation:              Increased bowel meds as necessary 12.  Transaminitis  AST mildly elevated on 4/7, continue to monitor  LOS: 1 days A FACE TO FACE EVALUATION WAS PERFORMED  Charles Porter Charles Porter 10/18/2020, 1:07 PM

## 2020-10-18 NOTE — Progress Notes (Signed)
Inpatient Rehabilitation Care Coordinator Assessment and Plan Patient Details  Name: Charles Porter MRN: [id number] Date of Birth: 16-Feb-1936  Today's Date: 10/18/2020  Hospital Problems: Principal Problem:   Cerebellar stroke [facility]) Active Problems:   Transaminitis  Past Medical History: History reviewed. No pertinent past medical history. Past Surgical History: History reviewed. No pertinent surgical history. Social History:  reports that he has quit smoking. His smoking use included cigarettes. He has never used smokeless tobacco. He reports that he does not drink alcohol and does not use drugs.  Family / Support Systems Marital Status: Married Patient Roles: Spouse,Parent,Other (Comment) (employee) Spouse/Significant Other: Marylu Lund [phone] Children: Shantelle-daughter [phone]  Works 8-5 pm Other Supports: Two son's-one in [place] and one in [place] Anticipated Caregiver: Wife and daughter Ability/Limitations of Caregiver: Wife has back issues and can not provide physical care-daughter works until 5 pm Caregiver Availability: 24/7 Family Dynamics: Close knit with three children and extended family-also has co-workers and friends who are supportive. Pt hopes to be mod/i by the time he leaves here.  Social History Preferred language: English Religion: Holiness/Pentecostal Cultural Background: No issues Education: HS Read: Yes Write: Yes Employment Status: Employed Name of Employer: [employer] Return to Work Plans: Unsure if able to will depend upon his recovery from this CVA Legal History/Current Legal Issues: No issues Guardian/Conservator: None-according to MD pt is capable of making his own decisions while here   Abuse/Neglect Abuse/Neglect Assessment Can Be Completed: Yes Physical Abuse: Denies Verbal Abuse: Denies Sexual Abuse: Denies Exploitation of patient/patient's resources: Denies Self-Neglect: Denies  Emotional Status Pt's affect, behavior and  adjustment status: Pt is doing ok but struggling with his nausea from the dizziness from his cerebelluar stroke. He has always been independent and worked so he knows no different from this. He becomes tearful at times discussing stroke Recent Psychosocial Issues: HTN but not really being treated for it Psychiatric History: No history deferred depression screen due to new on rehab and sick from dizziness from CVA. Due to how independent he has been and still working fulltime at age 85 will ask neuro-psych to see while here Substance Abuse History: No issues  Patient / Family Perceptions, Expectations & Goals Pt/Family understanding of illness & functional limitations: Pt and wife are able to explain his stroke and nasuea and dizziness. He does talk with the MD and feels his questions are being answered. He hopes to do well here and progress to independent before going home Premorbid pt/family roles/activities: Husband, father, employee, church member, friend, etc Anticipated changes in roles/activities/participation: resume Pt/family expectations/goals: Pt states: " I want to be able to take care of myself when I leave here, my wife can't help me with her back issues."  Wife states: " He needs to be walking beofore he leaves here.:  [employer]: None Premorbid Home Care/DME Agencies: None Transportation available at discharge: Self daughter will need to drive to appointments since wife doesn;t drive Resource referrals recommended: Neuropsychology  Discharge Planning Living Arrangements: Spouse/significant other Support Systems: Children,Spouse/significant other,Other relatives,Friends/neighbors Type of Residence: Private residence Community education officer Resources: Media planner (specify),Medicare Herbalist) Financial Resources: Employment Financial Screen Referred: No Living Expenses: Banker Management: Patient,Spouse Does the patient have any problems obtaining your  medications?: No Home Management: wife and pt-daughter will do now Patient/Family Preliminary Plans: Return home with wife who can be there but not provide any physical care due to her own back issues. Daughter is local but works but can stop by in the evenings. Will  await therapy team evaluations and work on discharge needs. Care Coordinator Barriers to Discharge: Decreased caregiver support Care Coordinator Barriers to Discharge Comments: Wife's back issues can not assist Care Coordinator Anticipated Follow Up Needs: HH/OP  Clinical Impression Pleasant gentleman even though nauseous, trying to participate in therapies. Has good supports via wife and daughter. He has always been independent and hopes to be when leaves here. Will await therapy team evaluations and work on discharge needs.  Lucy Chris 10/18/2020, 2:26 PM

## 2020-10-18 NOTE — Plan of Care (Signed)
  Problem: RH Balance Goal: LTG Patient will maintain dynamic sitting balance (PT) Description: LTG:  Patient will maintain dynamic sitting balance with assistance during mobility activities (PT) Flowsheets (Taken 10/18/2020 1816) LTG: Pt will maintain dynamic sitting balance during mobility activities with:: Independent with assistive device  Goal: LTG Patient will maintain dynamic standing balance (PT) Description: LTG:  Patient will maintain dynamic standing balance with assistance during mobility activities (PT) Flowsheets (Taken 10/18/2020 1816) LTG: Pt will maintain dynamic standing balance during mobility activities with:: Supervision/Verbal cueing   Problem: Sit to Stand Goal: LTG:  Patient will perform sit to stand with assistance level (PT) Description: LTG:  Patient will perform sit to stand with assistance level (PT) Flowsheets (Taken 10/18/2020 1816) LTG: PT will perform sit to stand in preparation for functional mobility with assistance level: Supervision/Verbal cueing   Problem: RH Bed Mobility Goal: LTG Patient will perform bed mobility with assist (PT) Description: LTG: Patient will perform bed mobility with assistance, with/without cues (PT). Flowsheets (Taken 10/18/2020 1816) LTG: Pt will perform bed mobility with assistance level of: Independent   Problem: RH Bed to Chair Transfers Goal: LTG Patient will perform bed/chair transfers w/assist (PT) Description: LTG: Patient will perform bed to chair transfers with assistance (PT). Flowsheets (Taken 10/18/2020 1816) LTG: Pt will perform Bed to Chair Transfers with assistance level: Supervision/Verbal cueing   Problem: RH Car Transfers Goal: LTG Patient will perform car transfers with assist (PT) Description: LTG: Patient will perform car transfers with assistance (PT). Flowsheets (Taken 10/18/2020 1816) LTG: Pt will perform car transfers with assist:: Supervision/Verbal cueing   Problem: RH Ambulation Goal: LTG Patient will  ambulate in controlled environment (PT) Description: LTG: Patient will ambulate in a controlled environment, # of feet with assistance (PT). Flowsheets (Taken 10/18/2020 1816) LTG: Pt will ambulate in controlled environ  assist needed:: Supervision/Verbal cueing LTG: Ambulation distance in controlled environment: 150' with LRAD Goal: LTG Patient will ambulate in home environment (PT) Description: LTG: Patient will ambulate in home environment, # of feet with assistance (PT). Flowsheets (Taken 10/18/2020 1816) LTG: Pt will ambulate in home environ  assist needed:: Supervision/Verbal cueing LTG: Ambulation distance in home environment: 25' with LRAD   Problem: RH Wheelchair Mobility Goal: LTG Patient will propel w/c in controlled environment (PT) Description: LTG: Patient will propel wheelchair in controlled environment, # of feet with assist (PT) Flowsheets (Taken 10/18/2020 1816) LTG: Pt will propel w/c in controlled environ  assist needed:: Supervision/Verbal cueing LTG: Propel w/c distance in controlled environment: 150'   Problem: RH Stairs Goal: LTG Patient will ambulate up and down stairs w/assist (PT) Description: LTG: Patient will ambulate up and down # of stairs with assistance (PT) Flowsheets (Taken 10/18/2020 1816) LTG: Pt will ambulate up/down stairs assist needed:: Supervision/Verbal cueing LTG: Pt will  ambulate up and down number of stairs: 14 with 1 HR  [name], PT

## 2020-10-18 NOTE — Progress Notes (Signed)
Inpatient Rehabilitation Center Individual Statement of Services  Patient Name:  Charles Porter  Date:  10/18/2020  Welcome to the Inpatient Rehabilitation Center.  Our goal is to provide you with an individualized program based on your diagnosis and situation, designed to meet your specific needs.  With this comprehensive rehabilitation program, you will be expected to participate in at least 3 hours of rehabilitation therapies Monday-Friday, with modified therapy programming on the weekends.  Your rehabilitation program will include the following services:  Physical Therapy (PT), Occupational Therapy (OT), Speech Therapy (ST), 24 hour per day rehabilitation nursing, Neuropsychology, Care Coordinator, Rehabilitation Medicine, Nutrition Services and Pharmacy Services  Weekly team conferences will be held on Wednesday to discuss your progress.  Your Inpatient Rehabilitation Care Coordinator will talk with you frequently to get your input and to update you on team discussions.  Team conferences with you and your family in attendance may also be held.  Expected length of stay: 7-10 days  Overall anticipated outcome: supervision with cueing  Depending on your progress and recovery, your program may change. Your Inpatient Rehabilitation Care Coordinator will coordinate services and will keep you informed of any changes. Your Inpatient Rehabilitation Care Coordinator's name and contact numbers are listed  below.  The following services may also be recommended but are not provided by the Inpatient Rehabilitation Center:   Driving Evaluations  Home Health Rehabiltiation Services  Outpatient Rehabilitation Services  Vocational Rehabilitation   Arrangements will be made to provide these services after discharge if needed.  Arrangements include referral to agencies that provide these services.  Your insurance has been verified to be:  BCBS & medicare Your primary doctor is:  Earlie Lou  Pertinent information will be shared with your doctor and your insurance company.  Inpatient Rehabilitation Care Coordinator:  Dossie Der, [place] [phone] or Luna Glasgow  Information discussed with and copy given to patient by: Lucy Chris, 10/18/2020, 2:28 PM

## 2020-10-18 NOTE — H&P (Signed)
Physical Medicine and Rehabilitation Admission H&P    Chief Complaint  Patient presents with  . Stroke with functional deficits   HPI: Charles Porter is an 85 year old male in relatively good health but no medical care for years; who was admitted on 10/13/2020 with 4-day history of dizziness with feeling of off balance and found to have malignant hypertension with SBP- 240's.  History taken from chart review and patient. Charles Porter was treated with hydralazine with significant drop and onset of dysarthria and left facial droop. 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 Incidental note of 1.2 cm thyroid nodule and 4 mm LLL nodule.  Echocardiogram with ejection fraction of 60 to 65%, severe concentric LVH with grade 1 DD.  Dr. Roda Shutters felt that stroke likely embolic due to VA stenosis/occlusion but need to rule out cardioembolic source. SBP goal 140-150 range given b/l VA stenosis and outpatient follow up with [facility].   Neurology recommended ASA 325 mg/Plavix X 3 months followed by ASA alone.  30 day cardiac event monitor recommended at discharge--to contact cardiology prior to d/c. Patient continues to be limited by dizziness with balance deficits as well as significant elevations in BP with activity. Today reporting increase in nausea felt to be due to constipation. CIR recommended due to functional decline.  Please see preadmission assessment from earlier today as well.  Review of Systems  Constitutional: Negative for chills and fever.  HENT: Positive for hearing loss. Negative for tinnitus.   Eyes: Negative for blurred vision and double vision.  Respiratory: Negative for cough and shortness of breath.   Cardiovascular: Negative for chest pain and  palpitations.  Gastrointestinal: Positive for constipation and nausea (with activity). Negative for vomiting.  Genitourinary: Negative for dysuria and urgency.  Musculoskeletal: Negative for joint pain and myalgias.  Skin: Negative for itching and rash.  Neurological: Positive for dizziness and headaches (occipital HA with pressure). Negative for sensory change, speech change, focal weakness and weakness.  Psychiatric/Behavioral: Negative for memory loss. The patient does not have insomnia.   All other systems reviewed and are negative.   No known PMH.  No PSH.   Family History  Problem Relation Age of Onset  . Hypertension Neg Hx     Social History: Charles Porter has back issues.  Still works for [employer]. Charles Porter reports that Charles Porter has quit smoking. His smoking use included cigarettes. Charles Porter has never used smokeless tobacco. Charles Porter reports that Charles Porter does not drink alcohol and does not use drugs.   Allergies: No Known Allergies    Medications Prior to Admission  Medication Sig Dispense Refill  . Aspirin-Salicylamide-Caffeine (BC HEADACHE POWDER PO) Take 1 packet by mouth daily as needed (pain).      Drug Regimen Review  Drug regimen was reviewed and remains appropriate with no significant issues identified  Home: Home Living Family/patient expects to be discharged to:: Private residence Living Arrangements: Spouse/significant other Available Help at Discharge: Family Type of Home: House Home Access: Stairs to enter Secretary/administrator of Steps: 1 Home Layout: Two level,Bed/bath upstairs Alternate Level Stairs-Number of Steps: 14 Alternate Level Stairs-Rails: Right Bathroom Shower/Tub: Tub/shower unit,Walk-in shower Home Equipment: None  Lives With: Spouse   Functional History: Prior Function Level of Independence: Independent  Functional Status:  Mobility: Bed Mobility Overal  bed mobility: Needs Assistance Bed Mobility: Rolling,Sidelying to Sit Rolling:  Supervision Sidelying to sit: Min assist (with rail) Supine to sit: Min guard Sit to supine: Min guard General bed mobility comments: rolled from right to left with incr vertigo; rested and then side to sit with rail and min assist to steady/balance. cues for visual fixation and appropriate target Transfers Overall transfer level: Needs assistance Equipment used: Rolling walker (2 wheeled) Transfers: Sit to/from Stand Sit to Stand: Min assist General transfer comment: cued to use wide BOS and focus on object as come to stand; minimal sway noted on initial standing and left lean Ambulation/Gait Ambulation/Gait assistance: Min assist Gait Distance (Feet): 55 Feet (seated rest; 15 ft) Assistive device: Rolling walker (2 wheeled) Gait Pattern/deviations: Step-through pattern,Decreased stride length,Wide base of support General Gait Details: very rigid, short steps. Cues for focusing on specific objects as Charles Porter moved and turned (turn head, fixate on target, turn feet). Incresing left lean as Charles Porter fatigued. Gait velocity: very slow Gait velocity interpretation: <1.31 ft/sec, indicative of household ambulator    ADL: ADL Overall ADL's : Needs assistance/impaired Eating/Feeding: Independent,Sitting Grooming: Wash/dry hands,Wash/dry face,Min guard,Standing Upper Body Bathing: Set up,Supervision/ safety,Sitting Lower Body Bathing: Minimal assistance Upper Body Dressing : Set up,Supervision/safety,Sitting Lower Body Dressing: Minimal assistance Toilet Transfer: Ambulation,Minimal assistance,Cueing for safety Toileting- Clothing Manipulation and Hygiene: Min guard,Sit to/from stand Functional mobility during ADLs: Min guard  Cognition: Cognition Overall Cognitive Status: Within Functional Limits for tasks assessed Arousal/Alertness: Awake/alert Orientation Level: Oriented X4 Attention: Focused,Sustained Focused Attention: Appears intact Sustained Attention: Appears intact Memory: Appears  intact Awareness: Appears intact Problem Solving: Appears intact Executive Function: Organizing,Sequencing Sequencing: Appears intact Safety/Judgment: Appears intact Cognition Arousal/Alertness: Awake/alert Behavior During Therapy: WFL for tasks assessed/performed Overall Cognitive Status: Within Functional Limits for tasks assessed   Blood pressure (!) 152/73, pulse 93, temperature 98.5 F (36.9 C), temperature source Oral, resp. rate 16, height 5\' 6"  (1.676 m), weight 62.9 kg, SpO2 100 %. Physical Exam Vitals and nursing note reviewed.  Constitutional:      General: Charles Porter is not in acute distress.    Appearance: Charles Porter is normal weight.     Comments: Tends to hold his head (hurts when supine)  HENT:     Head: Normocephalic and atraumatic.     Right Ear: External ear normal.     Left Ear: External ear normal.     Nose: Nose normal.  Eyes:     General:        Right eye: No discharge.        Left eye: No discharge.     Extraocular Movements: Extraocular movements intact.  Cardiovascular:     Rate and Rhythm: Normal rate and regular rhythm.  Pulmonary:     Effort: Pulmonary effort is normal. No respiratory distress.     Breath sounds: No stridor.  Abdominal:     General: Abdomen is flat. Bowel sounds are normal. There is no distension.  Musculoskeletal:     Cervical back: Normal range of motion and neck supple.     Comments: No edema or tenderness in extremities  Skin:    General: Skin is warm and dry.  Neurological:     Mental Status: Charles Porter is alert and oriented to person, place, and time.     Comments: Alert. Decreased hearing No ataxia/dysmetria bilateral upper extremities Motor: 5/5 throughout  Psychiatric:        Behavior: Behavior normal.     Comments: Somewhat slowed versus hard of hearing  Results for orders placed or performed during the hospital encounter of 10/13/20 (from the past 48 hour(s))  CBC     Status: None   Collection Time: 10/17/20  3:00 AM  Result  Value Ref Range   WBC 7.0 4.0 - 10.5 K/uL   RBC 4.86 4.22 - 5.81 MIL/uL   Hemoglobin 15.1 13.0 - 17.0 g/dL   HCT 03.5 00.9 - 38.1 %   MCV 92.8 80.0 - 100.0 fL   MCH 31.1 26.0 - 34.0 pg   MCHC 33.5 30.0 - 36.0 g/dL   RDW 82.9 93.7 - 16.9 %   Platelets 293 150 - 400 K/uL   nRBC 0.0 0.0 - 0.2 %    Comment: Performed at [facility], [address], [place], [place] [address]  Basic metabolic panel     Status: None   Collection Time: 10/17/20  3:00 AM  Result Value Ref Range   Sodium 138 135 - 145 mmol/L   Potassium 3.8 3.5 - 5.1 mmol/L   Chloride 105 98 - 111 mmol/L   CO2 27 22 - 32 mmol/L   Glucose, Bld 96 70 - 99 mg/dL    Comment: Glucose reference range applies only to samples taken after fasting for at least 8 hours.   BUN 22 8 - 23 mg/dL   Creatinine, Ser 8.10 0.61 - 1.24 mg/dL   Calcium 9.1 8.9 - 17.5 mg/dL   GFR, Estimated >10 >25 mL/min    Comment: (NOTE) Calculated using the CKD-EPI Creatinine Equation (2021)    Anion gap 6 5 - 15    Comment: Performed at [facility], [address] [place], [place] [address]   No results found.     Medical Problem List and Plan: 1.  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  -patient may shower  -ELOS/Goals: 7-10 days/supervision/Mod I  Admit to CIR 2.  Antithrombotics: -DVT/anticoagulation:  Pharmaceutical: Lovenox  -antiplatelet therapy: DAPT 3. Pain Management:  N/A 4. Mood: LCSW to follow up for evaluation and support.   -antipsychotic agents: N/A 5. Neuropsych: This patient is capable of making decisions on his own behalf. 6. Skin/Wound Care: Routine pressure relief measures  7. Fluids/Electrolytes/Nutrition: Monitor I/Os 8. HTN: Monitor BP tid  Norvasc  Monitor with increased mobility 9.  Hyperlipidemia: Lipitor 10. Vestibular symptoms: May need to pre-medicate prior to  activity.  --Will add scopolamine patch.   --Vestibular eval  11. Slow transit constipation: Started on colace today. Will give dose of miralax also.   Increase bowel meds as necessary  Jacquelynn Cree, PA-C 10/17/2020  I have personally performed a face to face diagnostic evaluation, including, but not limited to relevant history and physical exam findings, of this patient and developed relevant assessment and plan.  Additionally, I have reviewed and concur with the physician assistant's documentation above.  Maryla Morrow, MD, ABPMR  The patient's status has not changed. Any changes from the pre-admission screening or documentation from the acute chart are noted above.   Maryla Morrow, MD, ABPMR  Late entry due to technical difficulties

## 2020-10-18 NOTE — Progress Notes (Signed)
Patient ID: [place], male   DOB: 12-Nov-1935, 85 y.o.   MRN: [id number] Met with the patient to review role of the nurse CM and address educational needs. Reviewed secondary stroke risks including HTN, HLD (LDL165/Total 245) Prediabetes (A1C 6.2). Reviewed dietary modifications and treatment to manage risks along with DAPT x 3 months then ASA solo. Patient noted he had been eating 4-5 slices of cheese with saltine crackers daily as a mid-morning snack in addition to his usual meals. Noted he had been having dizziness upon waking and periodically during the evening; could not sleep on his back, would take [facility] powder and the headache and symptoms would go away. The last time, the symptoms got worse and he ended up seeking medical attention. Patient receptive to information given. Continue to follow along to discharge to address questions and educational needs. Collaborate with the SW to facilitate preparation for discharge and smooth transition to home with wife. Margarito Liner

## 2020-10-18 NOTE — Evaluation (Signed)
Occupational Therapy Assessment and Plan  Patient Details  Name: Charles Porter MRN: [id number] Date of Birth: [birth year]  OT Diagnosis: abnormal posture, hemiplegia affecting non-dominant side and muscle weakness (generalized) Rehab Potential: Rehab Potential (ACUTE ONLY): Good ELOS: 7-10   Today's Date: 10/18/2020 OT Individual Time: 0800-0900 OT Individual Time Calculation (min): 60 min     Hospital Problem: Principal Problem:   Cerebellar stroke [facility])   Past Medical History: History reviewed. No pertinent past medical history. Past Surgical History: History reviewed. No pertinent surgical history.  Assessment & Plan Clinical Impression: 85 y.o. male with no significant PMH, takes no chronic medications. Presented to ED 10/13/20 with c/o 4 day h/o intermittent dizziness described as feeling off balance and room spinning sensation.  Ambulating with use of cane since that time. 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    Patient currently requires min with basic self-care skills secondary to muscle weakness, decreased cardiorespiratoy endurance, motor apraxia, decreased coordination and decreased motor planning, central origin and decreased sitting balance, decreased standing balance, decreased postural control, hemiplegia and decreased balance strategies.  Prior to hospitalization, patient could complete BADL/IADL with independent .  Patient will benefit from skilled intervention to decrease level of assist with basic self-care skills and increase independence with basic self-care skills prior to discharge home with care partner.  Anticipate patient will require 24 hour supervision and follow up home health.  OT - End of Session Activity Tolerance: Tolerates 30+ min activity with multiple  rests Endurance Deficit: Yes OT Assessment Rehab Potential (ACUTE ONLY): Good OT Barriers to Discharge: Decreased caregiver support OT Patient demonstrates impairments in the following area(s): Balance;Endurance;Motor;Safety;Vision OT Basic ADL's Functional Problem(s): Grooming;Bathing;Dressing;Toileting OT Transfers Functional Problem(s): Toilet;Tub/Shower OT Plan OT Intensity: Minimum of 1-2 x/day, 45 to 90 minutes OT Frequency: 5 out of 7 days OT Duration/Estimated Length of Stay: 7-10 OT Treatment/Interventions: Balance/vestibular training;Discharge planning;Pain management;Self Care/advanced ADL retraining;Therapeutic Activities;UE/LE Coordination activities;Disease mangement/prevention;Functional mobility training;Patient/family education;Skin care/wound managment;Therapeutic Exercise;Visual/perceptual remediation/compensation;Community reintegration;DME/adaptive equipment instruction;Neuromuscular re-education;Psychosocial support;UE/LE Strength taining/ROM OT Self Feeding Anticipated Outcome(s): MOD I OT Basic Self-Care Anticipated Outcome(s): S OT Toileting Anticipated Outcome(s): S OT Bathroom Transfers Anticipated Outcome(s): S OT Recommendation Patient destination: Home Follow Up Recommendations: Home health OT Equipment Recommended: 3 in 1 bedside comode;Tub/shower bench   OT Evaluation Precautions/Restrictions  Precautions Precautions: Fall Precaution Comments: vertigo Restrictions Weight Bearing Restrictions: No General Chart Reviewed: Yes Family/Caregiver Present: No Vital Signs Therapy Vitals Pulse Rate: 88 BP: (!) 154/97 Patient Position (if appropriate): Sitting Pain   Home Living/Prior Functioning Home Living Family/patient expects to be discharged to:: Private residence Living Arrangements: Spouse/significant other Available Help at Discharge: Available 24 hours/day Type of Home: House Home Access: Stairs to enter Technical brewer of Steps:  1 Home Layout: Two level,Bed/bath upstairs Alternate Level Stairs-Number of Steps: 14 Alternate Level Stairs-Rails: Right Bathroom Shower/Tub: Tub/shower unit,Walk-in shower,Curtain Bathroom Toilet: Standard Bathroom Accessibility: Yes  Lives With: Spouse IADL History Education: some schooling after high school Prior Function Level of Independence: Independent with basic ADLs,Independent with homemaking with ambulation  Able to Take Stairs?: Yes Driving: Yes Vocation: Full time employment Leisure: Hobbies-yes (Comment) Comments: bowling Vision Baseline Vision/History: Wears glasses Wears Glasses: Reading only Patient Visual Report: No change from baseline Vision Assessment?: Vision impaired- to be further tested in functional context Perception  Perception: Within Functional Limits Praxis Praxis:  Impaired Praxis Impairment Details: Motor planning Cognition Overall Cognitive Status: Within Functional Limits for tasks assessed Arousal/Alertness: Awake/alert Orientation Level: Person;Place;Situation Person: Oriented Place: Oriented Situation: Oriented Year: 2022 Month: April Day of Week: Correct Memory: Appears intact Immediate Memory Recall: Sock;Blue;Bed Memory Recall Sock: Without Cue Memory Recall Bed: Without Cue Sustained Attention: Appears intact Awareness: Appears intact Sequencing: Appears intact Safety/Judgment: Appears intact Sensation Sensation Light Touch: Appears Intact Coordination Gross Motor Movements are Fluid and Coordinated: Yes Fine Motor Movements are Fluid and Coordinated: No Motor  Motor Motor: Hemiplegia Motor - Skilled Clinical Observations: mild L hemiplegia  Trunk/Postural Assessment  Cervical Assessment Cervical Assessment:  (head forward) Thoracic Assessment Thoracic Assessment:  (rounded shoulders) Lumbar Assessment Lumbar Assessment:  (post pelvic tilt) Postural Control Postural Control: Deficits on evaluation   Balance Balance Balance Assessed: Yes Dynamic Sitting Balance Dynamic Sitting - Level of Assistance: 4: Min assist;5: Stand by assistance Dynamic Sitting Balance - Compensations: fatigue-L lean Dynamic Standing Balance Dynamic Standing - Level of Assistance: 4: Min assist Extremity/Trunk Assessment RUE Assessment RUE Assessment: Within Functional Limits LUE Assessment LUE Assessment: Exceptions to [facility] General Strength Comments: slightly weaker than RUE  Care Tool Care Tool Self Care Eating   Eating Assist Level: Set up assist    Oral Care    Oral Care Assist Level: Set up assist    Bathing   Body parts bathed by patient: Right arm;Left arm;Chest;Abdomen;Front perineal area;Buttocks;Right upper leg;Left upper leg;Right lower leg;Left lower leg;Face     Assist Level: Minimal Assistance - Patient > 75%    Upper Body Dressing(including orthotics)   What is the patient wearing?: Pull over shirt   Assist Level: Set up assist    Lower Body Dressing (excluding footwear)   What is the patient wearing?: Pants Assist for lower body dressing: Minimal Assistance - Patient > 75%    Putting on/Taking off footwear   What is the patient wearing?: Non-skid slipper socks Assist for footwear: Dependent - Patient 0% (d/t dizziness)       Care Tool Toileting Toileting activity   Assist for toileting: Minimal Assistance - Patient > 75%     Care Tool Bed Mobility Roll left and right activity        Sit to lying activity        Lying to sitting edge of bed activity   Lying to sitting edge of bed assist level: Minimal Assistance - Patient > 75%     Care Tool Transfers Sit to stand transfer   Sit to stand assist level: Minimal Assistance - Patient > 75%    Chair/bed transfer   Chair/bed transfer assist level: Minimal Assistance - Patient > 75%     Toilet transfer   Assist Level: Minimal Assistance - Patient > 75%     Care Tool Cognition Expression of Ideas and Wants  Expression of Ideas and Wants: Without difficulty (complex and basic) - expresses complex messages without difficulty and with speech that is clear and easy to understand   Understanding Verbal and Non-Verbal Content Understanding Verbal and Non-Verbal Content: Understands (complex and basic) - clear comprehension without cues or repetitions   Memory/Recall Ability *first 3 days only Memory/Recall Ability *first 3 days only: Current season;Location of own room;Staff names and faces;That he or she is in a hospital/hospital unit    Refer to Care Plan for [place] 1 OT Short Term Goal 1 (Week 1): STG =LTG dt ELOS  Recommendations for other services:  Therapeutic Recreation  Pet therapy and Outing/community reintegration   Skilled Therapeutic Intervention 1:1. Pt received in bed with OTe ducating on OT role/purpose, CIR, ELOS, and POC. Pt with questions about CVA origin and recovery with discussion with OT and MD in room. Pt completes all mobility with MIN A overall and VC for gaze stabiliation. Dressing/bathing with MIN A for standing balance, set up for UB bathing and A to doff/don footwear. Pt with general dizziness throughout session but subsides with breaks after movement. No nystagmus noted. Exited session with pt seated in bed, exit alarm on and call light in reach   Mobility  Bed Mobility Bed Mobility: Sit to Supine Sit to Supine: Minimal Assistance - Patient > 75% Transfers Sit to Stand: Minimal Assistance - Patient > 75% Stand to Sit: Minimal Assistance - Patient > 75%   Discharge Criteria: Patient will be discharged from OT if patient refuses treatment 3 consecutive times without medical reason, if treatment goals not met, if there is a change in medical status, if patient makes no progress towards goals or if patient is discharged from hospital.  The above assessment, treatment plan, treatment alternatives and goals were discussed and mutually agreed  upon: by patient  Tonny Branch 10/18/2020, 9:01 AM

## 2020-10-19 DIAGNOSIS — R42 Dizziness and giddiness: Secondary | ICD-10-CM

## 2020-10-19 NOTE — Progress Notes (Signed)
Occupational Therapy Session Note  Patient Details  Name: KENYATTE CHATMON MRN: [id number] Date of Birth: 11/01/1935  Today's Date: 10/19/2020 OT Individual Time: 1030-1100 OT Individual Time Calculation (min): 30 min    Short Term Goals: Week 1:  OT Short Term Goal 1 (Week 1): STG =LTG dt ELOS  Skilled Therapeutic Interventions/Progress Updates:    Pt seen this session to focus on transitional movement of sit to stand with gaze stabilization to avoid dizziness and dynamic standing balance.  Pt sat to EOB and worked on sit to stands slowly while looking at the letter A on the wall, once in stand he reached his arms overhead for more challenge and then returned to sit.  Repeated this numerous times slowly with no exacerbation of symptoms.  He then transitioned to recliner with close S.  Chair alarm set and all needs met.   Therapy Documentation Precautions:  Precautions Precautions: Fall Precaution Comments: vertigo Restrictions Weight Bearing Restrictions: No    Vital Signs: Therapy Vitals Temp: 98.3 F (36.8 C) Temp Source: Oral Pulse Rate: 78 Resp: 18 BP: (!) 160/81 Patient Position (if appropriate): Lying Oxygen Therapy SpO2: 97 % O2 Device: Room Air Pain: Pain Assessment Pain Scale: 0-10 Pain Score: 0-No pain   Therapy/Group: Individual Therapy  [place] 10/19/2020, 8:32 AM

## 2020-10-19 NOTE — Plan of Care (Signed)
  Problem: Consults Goal: RH STROKE PATIENT EDUCATION Description: See Patient Education module for education specifics  Outcome: Progressing   Problem: RH BOWEL ELIMINATION Goal: RH STG MANAGE BOWEL WITH ASSISTANCE Description: STG Manage Bowel with mod I Assistance. Outcome: Progressing Goal: RH STG MANAGE BOWEL W/MEDICATION W/ASSISTANCE Description: STG Manage Bowel with Medication with Mod I Assistance. Outcome: Progressing   Problem: RH SAFETY Goal: RH STG ADHERE TO SAFETY PRECAUTIONS W/ASSISTANCE/DEVICE Description: STG Adhere to Safety Precautions With mod I Assistance/Device. Outcome: Progressing   Problem: RH KNOWLEDGE DEFICIT Goal: RH STG INCREASE KNOWLEDGE OF HYPERTENSION Description: Patient will be able to demonstrate knowledge of heart healthy diet, blood pressure management, medication management with educational materials and handouts provided by staff independently. Outcome: Progressing Goal: RH STG INCREASE KNOWLEDGE OF STROKE PROPHYLAXIS Description: Patient will be able to demonstrate knowledge of medications used to prevent future strokes with educational materials and handouts provided by staff independently. Outcome: Progressing   Problem: RH KNOWLEDGE DEFICIT Goal: RH STG INCREASE KNOWLEGDE OF HYPERLIPIDEMIA Description: Patient will be able to demonstrate knowledge of medications used to manage HLD with educational materials and handouts provided by staff independently. Outcome: Progressing   

## 2020-10-19 NOTE — Progress Notes (Signed)
Physical Therapy Session Note  Patient Details  Name: Charles Porter MRN: [id number] Date of Birth: [birth year]  Today's Date: 10/19/2020 PT Individual Time:Session1: 0830-0900; Session2: 3662-9476 PT Individual Time Calculation (min): 30 min & 35 min  Short Term Goals: Week 1:  PT Short Term Goal 1 (Week 1): N/A due to ELOS  Skilled Therapeutic Interventions/Progress Updates:    Session1:  Patient supine and reports rough night due to toileting frequently as finally got rid of constipation.  Performed supine to sit with S.  Seated EOB for completing vestibular assessment as noted.  Patient seated EOB for vestibular adaptation exercises with near target taped to the wall performing 10 head turns, then rested, then 10 head nods (very slowly with S for ensuring target maintenance.)  Patient educated to perform and plans for written HEP later session today.   Vestibular Assessment - 10/19/20 0001      Oculomotor Exam   Head shaking Horizontal R beating nystagmus      Vestibulo-Ocular Reflex   VOR 1 Head Only (x 1 viewing) performed with near target horizontal head movements, then vertical with symptoms provoked to 5/10    VOR Cancellation Corrective saccades   catch up saccades with both directions eyes moving to R     Auditory   Comments decreased to scratch test on L compared to R pt reports is not new      Positional Testing   Sidelying Test Sidelying Right;Sidelying Left      Sidelying Right   Sidelying Right Duration 60 sec    Sidelying Right Symptoms Right nystagmus   mild     Sidelying Left   Sidelying Left Duration 60 sec    Sidelying Left Symptoms Right nystagmus   more vigorous initially, but continued throughout time on his side but denied spinning         Session2: Patient in recliner with wife and daughter in the room.  Discussed progress and pt performed sit to stand with CGA to RW.  Cues throughout for visual fixation on stationary target for compensation.  Patient  ambulated x 160' to therapy gym with RW and min to CGA with w/c follow for safety. Patient performed standing balance activities in parallel bars including side stepping, forward marching, forward tandem walk all with bilateral UE support.  Assisted pt in w/c to room. Issued HEP for seated vestibular adaptation and reviewed with pt performing with cues for target maintenance and to incresed speed of head movement and decrease turning radius.  Patient reported only mild symptoms, but educated to increase head speed for provoking symptoms slightly.  Patient transferred to recliner and left with call bell/needs in reach and chair alarm active.  Therapy Documentation Precautions:  Precautions Precautions: Fall Precaution Comments: vertigo Restrictions Weight Bearing Restrictions: No Pain: Pain Assessment Pain Scale: 0-10 Pain Score: 0-No pain   Therapy/Group: Individual Therapy  [name]  [place], PT 10/19/2020, 8:42 AM

## 2020-10-19 NOTE — Progress Notes (Signed)
Physical Therapy Session Note  Patient Details  Name: Charles Porter MRN: [id number] Date of Birth: 1935/07/20  Today's Date: 10/19/2020 PT Individual Time: 3662-9476 PT Individual Time Calculation (min): 37 min   Short Term Goals: Week 1:  PT Short Term Goal 1 (Week 1): N/A due to ELOS  Skilled Therapeutic Interventions/Progress Updates: Pt presents sitting in recliner and agreeable to therapy.  Pt transfers sit to stand w/ CGA and verbal cues for initiation.  Pt wheeled to Dayroom for time conservation.  Pt amb multiple trials w/ RW and CGA of 35' including turns to return to w/c.  Pt c/o "queasiness" and required seated rest breaks.  Pt educated on initiation and breathing technique for improved transfers.  Pt returned to room and amb from hallway to bed w/ RW and CGA.  Pt transferred sit to supine w/ supervision.  Bed alarm on and all needs in reach.     Therapy Documentation Precautions:  Precautions Precautions: Fall Precaution Comments: vertigo Restrictions Weight Bearing Restrictions: No General:   Vital Signs: Therapy Vitals Temp: 98.3 F (36.8 C) Pulse Rate: 70 Resp: 18 BP: (!) 154/73 Patient Position (if appropriate): Sitting Oxygen Therapy SpO2: 100 % O2 Device: Room Air Pain: pt has no c/o pain.      Therapy/Group: Individual Therapy  Lucio Edward 10/19/2020, 3:59 PM

## 2020-10-19 NOTE — Progress Notes (Signed)
[place] PHYSICAL MEDICINE & REHABILITATION PROGRESS NOTE  Subjective/Complaints: Patient seen sitting up at the edge of his bed working with therapy this morning.  States he slept fairly well overnight.  Initially was constipated, but later had a bowel movement and felt better afterward.  He notes vertigo yesterday, which is improving, discussed with therapies as well.  He is question regarding prognosis.  ROS: Denies CP, SOB, N/V/D  Objective: Vital Signs: Blood pressure (!) 160/81, pulse 78, temperature 98.3 F (36.8 C), temperature source Oral, resp. rate 18, height 5\' 6"  (1.676 m), weight 61.9 kg, SpO2 97 %. No results found. Recent Labs    10/17/20 0300 10/18/20 0543  WBC 7.0 5.6  HGB 15.1 15.4  HCT 45.1 45.2  PLT 293 314   Recent Labs    10/17/20 0300 10/18/20 0543  NA 138 139  K 3.8 3.7  CL 105 105  CO2 27 26  GLUCOSE 96 111*  BUN 22 21  CREATININE 1.16 1.13  CALCIUM 9.1 9.1    Intake/Output Summary (Last 24 hours) at 10/19/2020 1206 Last data filed at 10/19/2020 0715 Gross per 24 hour  Intake 190 ml  Output --  Net 190 ml        Physical Exam: BP (!) 160/81 (BP Location: Left Arm)   Pulse 78   Temp 98.3 F (36.8 C) (Oral)   Resp 18   Ht 5\' 6"  (1.676 m)   Wt 61.9 kg   SpO2 97%   BMI 22.03 kg/m  Constitutional: No distress . Vital signs reviewed. HENT: Normocephalic.  Atraumatic. Eyes: EOMI. No discharge. Cardiovascular: No JVD.  RRR. Respiratory: Normal effort.  No stridor.  Bilateral clear to auscultation. GI: Non-distended.  BS +. Skin: Warm and dry.  Intact. Psych: Normal mood.  Normal behavior. Musc: No edema in extremities.  No tenderness in extremities. Neuro: Alert HOH Motor: 5/5 throughout, stable  Assessment/Plan: 1. Functional deficits which require 3+ hours per day of interdisciplinary therapy in a comprehensive inpatient rehab setting.  Physiatrist is providing close team supervision and 24 hour management of active medical  problems listed below.  Physiatrist and rehab team continue to assess barriers to discharge/monitor patient progress toward functional and medical goals   Care Tool:  Bathing    Body parts bathed by patient: Right arm,Left arm,Chest,Abdomen,Front perineal area,Buttocks,Right upper leg,Left upper leg,Right lower leg,Left lower leg,Face         Bathing assist Assist Level: Minimal Assistance - Patient > 75%     Upper Body Dressing/Undressing Upper body dressing   What is the patient wearing?: Hospital gown only    Upper body assist Assist Level: Set up assist    Lower Body Dressing/Undressing Lower body dressing      What is the patient wearing?: Incontinence brief     Lower body assist Assist for lower body dressing: Minimal Assistance - Patient > 75%     Toileting Toileting    Toileting assist Assist for toileting: Contact Guard/Touching assist     Transfers Chair/bed transfer  Transfers assist     Chair/bed transfer assist level: Minimal Assistance - Patient > 75%     Locomotion Ambulation   Ambulation assist      Assist level: Minimal Assistance - Patient > 75% Assistive device: Walker-rolling Max distance: 50'   Walk 10 feet activity   Assist     Assist level: Minimal Assistance - Patient > 75% Assistive device: Walker-rolling   Walk 50 feet activity   Assist  Assist level: Minimal Assistance - Patient > 75% Assistive device: Walker-rolling    Walk 150 feet activity   Assist Walk 150 feet activity did not occur: Safety/medical concerns         Walk 10 feet on uneven surface  activity   Assist Walk 10 feet on uneven surfaces activity did not occur: Safety/medical concerns         Wheelchair     Assist Will patient use wheelchair at discharge?: No Type of Wheelchair: Manual    Wheelchair assist level: Supervision/Verbal cueing Max wheelchair distance: 52'    Wheelchair 50 feet with 2 turns  activity    Assist        Assist Level: Supervision/Verbal cueing   Wheelchair 150 feet activity     Assist  Wheelchair 150 feet activity did not occur: Safety/medical concerns        Medical Problem List and Plan: 1.  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  Continue CIR 2.  Antithrombotics: -DVT/anticoagulation:  Pharmaceutical: Lovenox  CBC within normal limits on 4/7             -antiplatelet therapy: DAPT 3. Pain Management:  N/A 4. Mood: LCSW to follow up for evaluation and support.              -antipsychotic agents: N/A 5. Neuropsych: This patient is capable of making decisions on his own behalf. 6. Skin/Wound Care: Routine pressure relief measures  7. Fluids/Electrolytes/Nutrition: Monitor I/Os 8. HTN: Monitor BP tid             Norvasc 10mg  daily  Elevated on 4/8, will consider additional medications if persistent.             Monitor with increased mobility 9.  Dyslipidemia: Lipitor 10. Vestibular symptoms: May need to pre-medicate prior to activity.             --Added scopolamine patch              --Vestibular eval   Appears to be improving 11. Slow transit constipation:   Improving             Increased bowel meds as necessary 12.  Transaminitis  AST mildly elevated on 4/7, continue to monitor  LOS: 2 days A FACE TO FACE EVALUATION WAS PERFORMED  Leoda Smithhart 6/7 10/19/2020, 12:06 PM

## 2020-10-19 NOTE — Progress Notes (Signed)
Occupational Therapy Session Note  Patient Details  Name: Charles Porter MRN: [id number] Date of Birth: Feb 22, 1936  Today's Date: 10/19/2020 OT Individual Time: 0700-0758 OT Individual Time Calculation (min): 58 min    Short Term Goals: Week 1:  OT Short Term Goal 1 (Week 1): STG =LTG dt ELOS  Skilled Therapeutic Interventions/Progress Updates:    1;1. Pt received in room with RN in room finishing toileting. Pt reporting needing more time to void bowel. Pt intervention for constipation and been dealing with it all night. Pt declines bathing, but after toileting ambulates out of bathroom with RW to sink to transfer to w/c to wash hands and brush teeth. Pt dons pants with MIN A for standing balance only at sit to stand level at sink. OT educates on TTB adaptation for tub shower and sticker strips to decrease fall risk while habituating with vertigo symptoms and pt agrees will be safest in short term for showering. Pt able to complete with CGA using RW and VC for gaze stabilization. Pt sits to complete trail making B test at [facility] and completes test in in 2 min 42 sec with no errors and 1 cue to recall number before letter. Exited session with pt seated in bed, exit alarm on and call light in reach   Therapy Documentation Precautions:  Precautions Precautions: Fall Precaution Comments: vertigo Restrictions Weight Bearing Restrictions: No General:   Vital Signs: Therapy Vitals Temp: 98.3 F (36.8 C) Temp Source: Oral Pulse Rate: 78 Resp: 18 BP: (!) 160/81 Patient Position (if appropriate): Lying Oxygen Therapy SpO2: 97 % O2 Device: Room Air Pain:   ADL:   Vision   Perception    Praxis   Exercises:   Other Treatments:     Therapy/Group: Individual Therapy  Shon Hale 10/19/2020, 6:47 AM

## 2020-10-20 NOTE — Progress Notes (Signed)
Physical Therapy Session Note  Patient Details  Name: [name] MRN: [id number] Date of Birth: [birth year]  Today's Date: 10/20/2020 PT Individual Time: 6286-3817 PT Individual Time Calculation (min): 69 min   Short Term Goals: Week 1:  PT Short Term Goal 1 (Week 1): N/A due to ELOS  Skilled Therapeutic Interventions/Progress Updates:   Pt received sitting in WC and agreeable to PT. Pt performed WC mobility through hall with supervision assist from PT with cues for improved gaze stabilization and turning control to navigate doorways.   Gait training with RW x 179f and supervision assist from PT, mild LOB with turn to the L, but able to correct without additional assist from PT. Dynamic gait training with RW to weave through 8 cones with supervision assist for safety and cues to prevent crossing feet in to turn and reducing fall risk.  PT also instructed pt in gait training without AD x 650fwith CGA-min assist from PT with cues for midline orientation in turn to prevent lateral LOB to the R. Pt then performed weave through 8 cones with min assist and mild lateral lean noted to the R; able to correct slightly with cues. Sit<>stand performed with supervision assist without AD throughout session.  [facility] while engaged in cognitive/gross motor tsak of wii bowling. 1 UE supported on RW throughout with min cues for improved midline and coordination of control of game remote. Patient returned to room and [facility] reach and all needs met.         Therapy Documentation Precautions:  Precautions Precautions: Fall Precaution Comments: vertigo Restrictions Weight Bearing Restrictions: No Vital Signs: Therapy Vitals Temp: 98 F (36.7 C) Pulse Rate: 62 Resp: 18 BP: (!) 167/67 Patient Position (if appropriate): Sitting Oxygen Therapy SpO2: 100 % O2 Device: Room Air Pain: denies   Therapy/Group:  Individual Therapy  AuLorie Phenix/03/2021, 4:52 PM

## 2020-10-20 NOTE — Progress Notes (Signed)
Physical Therapy Session Note  Patient Details  Name: Charles Porter MRN: [id number] Date of Birth: 12/30/1935  Today's Date: 10/20/2020 PT Individual Time: 1350-1448 PT Individual Time Calculation (min): 58 min   Short Term Goals: Week 1:  PT Short Term Goal 1 (Week 1): N/A due to ELOS  Skilled Therapeutic Interventions/Progress Updates:   Pt resting in recliner.  He denied pain or vertigo.  Pt stated that he needed to urinate.  Sit> stand with close supervision to use urinal in standing; pt managed clothing with supervision.  Hand washing in sitting in w/c, supervision.  Gait training with RW on level tile, close supervision.  Up/down 12 steps 2 rails, with self selected step- through method, CGA.  Trial 2 of 12 steps with R rail ascending, L rail descending, as per home setting, with CGA and self selected step- to method when descending.  Gait training without AD on level tile, HHA, x 140' with 1 significant LOB to R requiring max assist to prevent fall, min/mod assist otherwise due to listing to R.  Therapeutic activity in standing, removing and replacing tools from tool box, requirng head turns and many hand movements.  Pt had no problems with vertigo; mild problems with finding q tool per category in crowded box.  Gait x 20' into room, without AD, including turns, min assist.  Pt requested transferring to, and remaining in wc.  Seat belt alarm set and needs left at hand.       Therapy Documentation Precautions:  Precautions Precautions: Fall Precaution Comments: vertigo Restrictions Weight Bearing Restrictions: No        Therapy/Group: Individual Therapy  Merwyn Hodapp 10/20/2020, 4:46 PM

## 2020-10-20 NOTE — Progress Notes (Signed)
[place] PHYSICAL MEDICINE & REHABILITATION PROGRESS NOTE  Subjective/Complaints:  Patient is ready to eat lunch this morning he has no complaints today. ROS: Denies CP, SOB, N/V/D  Objective: Vital Signs: Blood pressure 136/70, pulse 62, temperature 98.8 F (37.1 C), temperature source Oral, resp. rate 18, height 5\' 6"  (1.676 m), weight 61.9 kg, SpO2 100 %. No results found. Recent Labs    10/18/20 0543  WBC 5.6  HGB 15.4  HCT 45.2  PLT 314   Recent Labs    10/18/20 0543  NA 139  K 3.7  CL 105  CO2 26  GLUCOSE 111*  BUN 21  CREATININE 1.13  CALCIUM 9.1    Intake/Output Summary (Last 24 hours) at 10/20/2020 1157 Last data filed at 10/20/2020 0833 Gross per 24 hour  Intake 480 ml  Output 850 ml  Net -370 ml        Physical Exam: BP 136/70 (BP Location: Right Arm)   Pulse 62   Temp 98.8 F (37.1 C) (Oral)   Resp 18   Ht 5\' 6"  (1.676 m)   Wt 61.9 kg   SpO2 100%   BMI 22.03 kg/m   General: No acute distress Mood and affect are appropriate Heart: Regular rate and rhythm no rubs murmurs or extra sounds Lungs: Clear to auscultation, breathing unlabored, no rales or wheezes Abdomen: Positive bowel sounds, soft nontender to palpation, nondistended Extremities: No clubbing, cyanosis, or edema Skin: No evidence of breakdown, no evidence of rash  Neuro: Alert Cerebellar normal finger-nose-finger as well as toe to finger bilaterally HOH Motor: 5/5 throughout, stable  Assessment/Plan: 1. Functional deficits which require 3+ hours per day of interdisciplinary therapy in a comprehensive inpatient rehab setting.  Physiatrist is providing close team supervision and 24 hour management of active medical problems listed below.  Physiatrist and rehab team continue to assess barriers to discharge/monitor patient progress toward functional and medical goals   Care Tool:  Bathing    Body parts bathed by patient: Right arm,Left arm,Chest,Abdomen,Front perineal  area,Buttocks,Right upper leg,Left upper leg,Right lower leg,Left lower leg,Face         Bathing assist Assist Level: Minimal Assistance - Patient > 75%     Upper Body Dressing/Undressing Upper body dressing   What is the patient wearing?: Hospital gown only    Upper body assist Assist Level: Set up assist    Lower Body Dressing/Undressing Lower body dressing      What is the patient wearing?: Incontinence brief     Lower body assist Assist for lower body dressing: Minimal Assistance - Patient > 75%     Toileting Toileting    Toileting assist Assist for toileting: Contact Guard/Touching assist     Transfers Chair/bed transfer  Transfers assist     Chair/bed transfer assist level: Contact Guard/Touching assist     Locomotion Ambulation   Ambulation assist      Assist level: Contact Guard/Touching assist Assistive device: Walker-rolling Max distance: 160'   Walk 10 feet activity   Assist     Assist level: Contact Guard/Touching assist Assistive device: Walker-rolling   Walk 50 feet activity   Assist    Assist level: Contact Guard/Touching assist Assistive device: Walker-rolling    Walk 150 feet activity   Assist Walk 150 feet activity did not occur: Safety/medical concerns  Assist level: Contact Guard/Touching assist Assistive device: Walker-rolling    Walk 10 feet on uneven surface  activity   Assist Walk 10 feet on uneven surfaces activity did  not occur: Safety/medical concerns         Wheelchair     Assist Will patient use wheelchair at discharge?: No Type of Wheelchair: Manual    Wheelchair assist level: Supervision/Verbal cueing Max wheelchair distance: 52'    Wheelchair 50 feet with 2 turns activity    Assist        Assist Level: Supervision/Verbal cueing   Wheelchair 150 feet activity     Assist  Wheelchair 150 feet activity did not occur: Safety/medical concerns        Medical Problem  List and Plan: 1.  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  Continue CIR PT, OT 2.  Antithrombotics: -DVT/anticoagulation:  Pharmaceutical: Lovenox  CBC within normal limits on 4/7             -antiplatelet therapy: DAPT 3. Pain Management:  N/A 4. Mood: LCSW to follow up for evaluation and support.              -antipsychotic agents: N/A 5. Neuropsych: This patient is capable of making decisions on his own behalf. 6. Skin/Wound Care: Routine pressure relief measures  7. Fluids/Electrolytes/Nutrition: Monitor I/Os 8. HTN: Monitor BP tid             Norvasc 10mg  daily   Vitals:   10/19/20 2039 10/20/20 0508  BP: (!) 155/63 136/70  Pulse: 67 62  Resp: 18 18  Temp: 98.5 F (36.9 C) 98.8 F (37.1 C)  SpO2: 96% 100%   9.  Dyslipidemia: Lipitor 10. Vestibular symptoms: May need to pre-medicate prior to activity.             --Added scopolamine patch              --Vestibular eval   Appears to be improving 11. Slow transit constipation:   Improving             Increased bowel meds as necessary 12.  Transaminitis  AST mildly elevated on 4/7, continue to monitor  LOS: 3 days A FACE TO FACE EVALUATION WAS PERFORMED  6/7 10/20/2020, 11:57 AM

## 2020-10-20 NOTE — Progress Notes (Signed)
Occupational Therapy Session Note  Patient Details  Name: Charles Porter MRN: [id number] Date of Birth: 1936-04-01  Today's Date: 10/20/2020 OT Individual Time: 9217-8375 OT Individual Time Calculation (min): 45 min    Short Term Goals: Week 1:  OT Short Term Goal 1 (Week 1): STG =LTG dt ELOS  Skilled Therapeutic Interventions/Progress Updates:     Pt received in bed with no pain and no "woosieness" throughout session. Increased L lean noted in standing and in ambulation. RN made aware.  ADL:  Pt completes bathing with supervision sit to stand using grab bar Pt completes UB dressing with set up after applying lotion Pt completes LB dressing with supervision  Pt completes footwear with set up after applying lotion Pt completes toileting with supervision sit to stand with RW and grab bar Pt completes toileting transfer with MIN A d/t L lateral LOB crossing bathroom uphill threshold stepping up with LLE Pt completes shower/Tub transfer with supervision with RW and VC for safe RW management. PT completes grooming at sink with set up  Pt left at end of session in recliner with exit alarm on, call light in reach and all needs met   Therapy Documentation Precautions:  Precautions Precautions: Fall Precaution Comments: vertigo Restrictions Weight Bearing Restrictions: No General:   Vital Signs: Therapy Vitals Temp: 98.8 F (37.1 C) Temp Source: Oral Pulse Rate: 62 Resp: 18 BP: 136/70 Patient Position (if appropriate): Lying Oxygen Therapy SpO2: 100 % O2 Device: Room Air Pain:   ADL:   Vision   Perception    Praxis   Exercises:   Other Treatments:     Therapy/Group: Individual Therapy  Tonny Branch 10/20/2020, 6:50 AM

## 2020-10-20 NOTE — Plan of Care (Signed)
  Problem: Consults Goal: RH STROKE PATIENT EDUCATION Description: See Patient Education module for education specifics  Outcome: Progressing   Problem: RH BOWEL ELIMINATION Goal: RH STG MANAGE BOWEL WITH ASSISTANCE Description: STG Manage Bowel with mod I Assistance. Outcome: Progressing Goal: RH STG MANAGE BOWEL W/MEDICATION W/ASSISTANCE Description: STG Manage Bowel with Medication with Mod I Assistance. Outcome: Progressing   Problem: RH SAFETY Goal: RH STG ADHERE TO SAFETY PRECAUTIONS W/ASSISTANCE/DEVICE Description: STG Adhere to Safety Precautions With mod I Assistance/Device. Outcome: Progressing   Problem: RH KNOWLEDGE DEFICIT Goal: RH STG INCREASE KNOWLEDGE OF HYPERTENSION Description: Patient will be able to demonstrate knowledge of heart healthy diet, blood pressure management, medication management with educational materials and handouts provided by staff independently. Outcome: Progressing Goal: RH STG INCREASE KNOWLEDGE OF STROKE PROPHYLAXIS Description: Patient will be able to demonstrate knowledge of medications used to prevent future strokes with educational materials and handouts provided by staff independently. Outcome: Progressing   Problem: RH KNOWLEDGE DEFICIT Goal: RH STG INCREASE KNOWLEGDE OF HYPERLIPIDEMIA Description: Patient will be able to demonstrate knowledge of medications used to manage HLD with educational materials and handouts provided by staff independently. Outcome: Progressing

## 2020-10-22 LAB — CBC
HCT: 38.8 % — ABNORMAL LOW (ref 39.0–52.0)
Hemoglobin: 13.1 g/dL (ref 13.0–17.0)
MCH: 30.9 pg (ref 26.0–34.0)
MCHC: 33.8 g/dL (ref 30.0–36.0)
MCV: 91.5 fL (ref 80.0–100.0)
Platelets: 278 10*3/uL (ref 150–400)
RBC: 4.24 MIL/uL (ref 4.22–5.81)
RDW: 12.8 % (ref 11.5–15.5)
WBC: 5.7 10*3/uL (ref 4.0–10.5)
nRBC: 0 % (ref 0.0–0.2)

## 2020-10-22 LAB — BASIC METABOLIC PANEL
Anion gap: 6 (ref 5–15)
BUN: 20 mg/dL (ref 8–23)
CO2: 26 mmol/L (ref 22–32)
Calcium: 8.9 mg/dL (ref 8.9–10.3)
Chloride: 106 mmol/L (ref 98–111)
Creatinine, Ser: 1.19 mg/dL (ref 0.61–1.24)
GFR, Estimated: >60 mL/min (ref >60)
Glucose, Bld: 94 mg/dL (ref 70–99)
Potassium: 4.1 mmol/L (ref 3.5–5.1)
Sodium: 138 mmol/L (ref 135–145)

## 2020-10-22 MED ORDER — LISINOPRIL 2.5 MG PO TABS
2.5000 mg | ORAL_TABLET | Freq: Every day | ORAL | Status: DC
  Administered 2020-10-22 – 2020-10-24 (×3): 2.5 mg via ORAL
  Filled 2020-10-22 (×3): qty 1

## 2020-10-22 NOTE — Progress Notes (Signed)
Occupational Therapy Session Note  Patient Details  Name: CHRISTIANJAMES SOULE MRN: [id number] Date of Birth: 12/30/1935  Today's Date: 10/22/2020 OT Individual Time: 1335-1400 OT Individual Time Calculation (min): 25 min    Short Term Goals: Week 1:  OT Short Term Goal 1 (Week 1): STG =LTG dt ELOS  Skilled Therapeutic Interventions/Progress Updates:    Pt received sitting in the recliner with no c/o pain. Pt agreeable to OT session. Pt completed functional mobility with RW to the ADL apt with supervision. Pt practiced tub transfer with problem solving through home accessibility and use of shower chair. Pt was able to complete transfer stepping in/out with supervision and then with sitting on shower chair. Pt completed 200 ft of functional mobility to his room. Pt completed toileting at supervision level overall. Pt was left sitting up in the recliner with all needs met.   Therapy Documentation Precautions:  Precautions Precautions: Fall Precaution Comments: vertigo Restrictions Weight Bearing Restrictions: No   Therapy/Group: Individual Therapy  Curtis Sites 10/22/2020, 6:22 AM

## 2020-10-22 NOTE — Progress Notes (Signed)
Patient ID: Charles Porter, male   DOB: 25-Apr-1936, 85 y.o.   MRN: [id number] Follow up with the patient regarding progress demonstrated in rehab. ELOS was 7-10 days. Discussion with the team notes that patient is doing well and discharge recommended for 10/26/20. Education provided on secondary stroke management and patient feels comfortable with medications and dietary modifications. Patient noted he felt like he would not need to be in rehab very long and appreciative of the update. Continue to follow along to discharge and address questions, and any educational needs as they arise. Pamelia Hoit

## 2020-10-22 NOTE — Progress Notes (Signed)
Physical Therapy Session Note  Patient Details  Name: Charles Porter MRN: [id number] Date of Birth: 07/31/1935  Today's Date: 10/22/2020 PT Individual Time:Session1: 9937-1696; Session2: 7893-8101   PT Individual Time Calculation (min): 59 min & 45 min  Short Term Goals: Week 1:  PT Short Term Goal 1 (Week 1): N/A due to ELOS  Skilled Therapeutic Interventions/Progress Updates:    Session1:  Patient in recliner in room and reports feeling better.  Denies current vertigo symptoms but does feel it when laying with pressure on the back of his head so sitting with pillow at back to avoid a lot of pressure on the back of his head.  Patient sit to stand with close S and moved around b/s table holding with 1 hand and CGA.  Ambulated with RW to therapy gym x 160' with CGA to S.  Performed Berg balance assessment as noted below.  Patient educated on fall risk.  Ambulated with SPC x 140' with CGA noted veering L and R but sequencing well with cane.  Performed stairs (6")x 8 with cane and rail after demonstration.  Ambulated 20' with RW to mat.  Demonstrated standing balance task and pt performed R/L rotation standing without UE support to retrieve ball and deliver ball x 10 reps.  Reports only mild increase in dizziness.  Patient requesting to toilet.  Ambulated to room with RW and S.  Toileted with distant S.  Washing hands at sink with S.  Demonstrated standing gaze stabilization exercise and pt performed with walker in front but without UE support with CGA and cues for target maintenance and increased speed of head movement to provoke dizziness more.  Patient with continued questions about vertigo and stroke and how it all relates to his dizziness so time spent for education on vestibular anatomy and how it affects balance/position sense and how abnormalities cause issues.  Patient assisted to recliner with feet elevated, chair alarm active and needs in reach.   Session2:Patient in recliner and reports  feeling some better since BM this morning.  Patient sit to stand with S to RW and ambulated to therapy gym with CGA to S.  Negotiated 12 steps R rail and cane min A due to LOB forward x 1.  Demonstrated step to versus step through pattern. Patient performed standing balance activity tapping alternate feet to 4" step with cane on R for support and CGA.  Patient stepping over and back hockey stick initially min A then with cues for cane position CGA.  Ambulated with SPC x 120' x 2 with cues for staying inside 2 floor tiles all the way down and performed with CGA to S.  Patient ambulated to dayroom with min A and cues for positioning to limit veering side to side x 180'.  Patient on Nu Step x 10 minutes level 3 UE/LE.  Patient ambulated with RW to room with CGA to S x 100'.  Asked to toilet and assisted to bathroom performed with S.  Daughter and grandson in the room and discussed progress.  Patient assisted to wash hands at sink and ambulated to recliner.  Left in recliner with all needs in reach and family in the room.   Therapy Documentation Precautions:  Precautions Precautions: Fall Precaution Comments: vertigo Restrictions Weight Bearing Restrictions: No Pain: Pain Assessment Pain Scale: 0-10 Pain Score: 0-No pain  Balance: Balance Balance Assessed: Yes Standardized Balance Assessment Standardized Balance Assessment: Berg Balance Test Berg Balance Test Sit to Stand: Able to stand  independently  using hands Standing Unsupported: Able to stand safely 2 minutes Sitting with Back Unsupported but Feet Supported on Floor or Stool: Able to sit safely and securely 2 minutes Stand to Sit: Controls descent by using hands Transfers: Able to transfer safely, minor use of hands Standing Unsupported with Eyes Closed: Able to stand 10 seconds safely Standing Ubsupported with Feet Together: Able to place feet together independently and stand 1 minute safely From Standing, Reach Forward with  Outstretched Arm: Can reach confidently >25 cm (10") From Standing Position, Pick up Object from Floor: Able to pick up shoe, needs supervision From Standing Position, Turn to Look Behind Over each Shoulder: Looks behind from both sides and weight shifts well Turn 360 Degrees: Able to turn 360 degrees safely but slowly Standing Unsupported, Alternately Place Feet on Step/Stool: Able to complete >2 steps/needs minimal assist Standing Unsupported, One Foot in Front: Able to plae foot ahead of the other independently and hold 30 seconds Standing on One Leg: Tries to lift leg/unable to hold 3 seconds but remains standing independently Total Score: 44    Therapy/Group: Individual Therapy  [name]  [name], PT 10/22/2020, 11:15 AM

## 2020-10-22 NOTE — Progress Notes (Addendum)
[place] PHYSICAL MEDICINE & REHABILITATION PROGRESS NOTE  Subjective/Complaints: He asks about his BP- what it is now and what it was upon admission. He asks me to call his wife to let her know.  He has no other complaints.   ROS: Denies CP, SOB, N/V/D  Objective: Vital Signs: Blood pressure (!) 162/74, pulse 64, temperature 98.6 F (37 C), resp. rate 17, height 5\' 6"  (1.676 m), weight 61.9 kg, SpO2 99 %. No results found. Recent Labs    10/22/20 0617  WBC 5.7  HGB 13.1  HCT 38.8*  PLT 278   Recent Labs    10/22/20 0617  NA 138  K 4.1  CL 106  CO2 26  GLUCOSE 94  BUN 20  CREATININE 1.19  CALCIUM 8.9    Intake/Output Summary (Last 24 hours) at 10/22/2020 1034 Last data filed at 10/22/2020 0920 Gross per 24 hour  Intake 800 ml  Output 1100 ml  Net -300 ml        Physical Exam: BP (!) 162/74   Pulse 64   Temp 98.6 F (37 C)   Resp 17   Ht 5\' 6"  (1.676 m)   Wt 61.9 kg   SpO2 99%   BMI 22.03 kg/m  Gen: no distress, normal appearing HEENT: oral mucosa pink and moist, NCAT Cardio: Reg rate Chest: normal effort, normal rate of breathing Abd: soft, non-distended Ext: no edema Psych: pleasant, normal affect Skin: intact Neuro: Alert Cerebellar normal finger-nose-finger as well as toe to finger bilaterally HOH Motor: 5/5 throughout, stable  Assessment/Plan: 1. Functional deficits which require 3+ hours per day of interdisciplinary therapy in a comprehensive inpatient rehab setting.  Physiatrist is providing close team supervision and 24 hour management of active medical problems listed below.  Physiatrist and rehab team continue to assess barriers to discharge/monitor patient progress toward functional and medical goals   Care Tool:  Bathing    Body parts bathed by patient: Right arm,Left arm,Chest,Abdomen,Front perineal area,Buttocks,Right upper leg,Left upper leg,Right lower leg,Left lower leg,Face         Bathing assist Assist Level:  Supervision/Verbal cueing     Upper Body Dressing/Undressing Upper body dressing   What is the patient wearing?: Pull over shirt    Upper body assist Assist Level: Set up assist    Lower Body Dressing/Undressing Lower body dressing      What is the patient wearing?: Pants     Lower body assist Assist for lower body dressing: Supervision/Verbal cueing     Toileting Toileting    Toileting assist Assist for toileting: Contact Guard/Touching assist     Transfers Chair/bed transfer  Transfers assist     Chair/bed transfer assist level: Contact Guard/Touching assist     Locomotion Ambulation   Ambulation assist      Assist level: Contact Guard/Touching assist Assistive device: Walker-rolling Max distance: 160'   Walk 10 feet activity   Assist     Assist level: Contact Guard/Touching assist Assistive device: Walker-rolling   Walk 50 feet activity   Assist    Assist level: Contact Guard/Touching assist Assistive device: Walker-rolling    Walk 150 feet activity   Assist Walk 150 feet activity did not occur: Safety/medical concerns  Assist level: Contact Guard/Touching assist Assistive device: Walker-rolling    Walk 10 feet on uneven surface  activity   Assist Walk 10 feet on uneven surfaces activity did not occur: Safety/medical concerns         Wheelchair  Assist Will patient use wheelchair at discharge?: No Type of Wheelchair: Manual    Wheelchair assist level: Supervision/Verbal cueing Max wheelchair distance: 68'    Wheelchair 50 feet with 2 turns activity    Assist        Assist Level: Supervision/Verbal cueing   Wheelchair 150 feet activity     Assist  Wheelchair 150 feet activity did not occur: Safety/medical concerns        Medical Problem List and Plan: 1.  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  Continue CIR PT, OT 2.  Impaired mobility: -DVT/anticoagulation:  Pharmaceutical: Continue Lovenox CBC within normal limits on 4/11             -antiplatelet therapy: DAPT 3. Pain Management:  N/A 4. Mood: LCSW to follow up for evaluation and support.              -antipsychotic agents: N/A 5. Neuropsych: This patient is capable of making decisions on his own behalf. 6. Skin/Wound Care: Routine pressure relief measures  7. Fluids/Electrolytes/Nutrition: Monitor I/Os 8. HTN: Monitor BP tid             Norvasc 10mg  daily  Add lisinopril 2.5mg    Vitals:   10/21/20 1949 10/22/20 0453  BP: (!) 177/68 (!) 162/74  Pulse: (!) 59 64  Resp: 16 17  Temp: 97.7 F (36.5 C) 98.6 F (37 C)  SpO2: 99% 99%   9.  Dyslipidemia: Continue Lipitor 10. Vestibular symptoms: May need to pre-medicate prior to activity.             --Continue scopolamine patch              --Vestibular eval   Appears to be improving 11. Slow transit constipation:   Improving             Increased bowel meds as necessary 12.  Transaminitis  AST mildly elevated on 4/7, continue to monitor 13. Disposition: d/c Friday. Lives with wife. Hospital follow-up with Dr. Wednesday scheduled on 11/14/20 9:20.  LOS: 5 days A FACE TO FACE EVALUATION WAS PERFORMED  01/14/21 Senetra Dillin 10/22/2020, 10:34 AM

## 2020-10-22 NOTE — Progress Notes (Signed)
Occupational Therapy Session Note  Patient Details  Name: Charles Porter MRN: [id number] Date of Birth: 1935-12-12  Today's Date: 10/22/2020 OT Individual Time: 0092-3300 OT Individual Time Calculation (min): 58 min    Short Term Goals: Week 1:  OT Short Term Goal 1 (Week 1): STG =LTG dt ELOS  Skilled Therapeutic Interventions/Progress Updates:    Patient in bed, alert and ready for therapy session.   He denies pain or nausea.  Supine to sitting edge of bed with CS.  Sit to stand and ambulation with RW to/from bed, shower bench, w/c with CS.  Able to doff clothing with supervision, shower completed in seated position with CS.  Dressing completed seated on shower bench set up/CS.  Oral care and grooming tasks in stance at sink with CS.  He ambulated to/from therapy gym with RW CS/CGA (rest break at mid point in both directions)  Completed visual scanning/reaction time tasks with BITS system in stance - trial 1 - dots, 2 minutes = reaction time 1.26, 92%.   Trial 2 - # 1-50 = reaction time 6.02, overall time 5:01, 82% He returned to recliner at close of session with CS, seat alarm set and call bell/tray table in reach.    Therapy Documentation Precautions:  Precautions Precautions: Fall Precaution Comments: vertigo Restrictions Weight Bearing Restrictions: No   Therapy/Group: Individual Therapy  Barrie Lyme 10/22/2020, 7:31 AM

## 2020-10-23 NOTE — Progress Notes (Signed)
Occupational Therapy Session Note  Patient Details  Name: Charles Porter MRN: [id number] Date of Birth: 1935/07/21  Today's Date: 10/23/2020 OT Individual Time: 1301-1346 OT Individual Time Calculation (min): 45 min    Short Term Goals: Week 1:  OT Short Term Goal 1 (Week 1): STG =LTG dt ELOS  Skilled Therapeutic Interventions/Progress Updates:    Pt received semi-reclined in bed, agreeable to therapy. Denies pain/dizziness throughout session. Set-up A to use urinal, cont void of bladder. Came to sitting EOB with distant S and use of bed features. STS and amb to and from bathroom close S. Shower transfer and doffed clothing with close S + use of RW/grab bar. Bathed full-body and donned shirt/underwear/pants/B socks with close S. Completed grooming at sink with RW with close S. Declined participation in gaze stabilization exercises 2/2 episode of emesis earlier today. Amb to and from gym with CGA and SPC, one LOB to the R with min A to correct. To target dynamic standing balance, activity tolerance, and visual scanning, stood for 1 min at [facility] to complete single target game for 1 min with 90% accuracy. Amb transfer to recliner with CGA.    Pt left in recliner with chair alarm engaged, call bell in reach, and all immediate needs met.    Therapy Documentation Precautions:  Precautions Precautions: Fall Precaution Comments: vertigo Restrictions Weight Bearing Restrictions: No  Pain: denies   ADL: See Care Tool for more details.  Therapy/Group: Individual Therapy  Volanda Napoleon MS, OTR/L  10/23/2020, 6:47 AM

## 2020-10-23 NOTE — Progress Notes (Signed)
[facility] PHYSICAL MEDICINE & REHABILITATION PROGRESS NOTE  Subjective/Complaints: No complaints this morning Trying to nap between therapy sessions Updated wife yesterday.  Bradycardic   ROS: denies CP, SOB, N/V/D  Objective: Vital Signs: Blood pressure 135/70, pulse (!) 58, temperature 97.9 F (36.6 C), resp. rate 16, height 5\' 6"  (1.676 m), weight 61.9 kg, SpO2 99 %. No results found. Recent Labs    10/22/20 0617  WBC 5.7  HGB 13.1  HCT 38.8*  PLT 278   Recent Labs    10/22/20 0617  NA 138  K 4.1  CL 106  CO2 26  GLUCOSE 94  BUN 20  CREATININE 1.19  CALCIUM 8.9    Intake/Output Summary (Last 24 hours) at 10/23/2020 1253 Last data filed at 10/23/2020 0750 Gross per 24 hour  Intake 400 ml  Output 600 ml  Net -200 ml        Physical Exam: BP 135/70   Pulse (!) 58   Temp 97.9 F (36.6 C)   Resp 16   Ht 5\' 6"  (1.676 m)   Wt 61.9 kg   SpO2 99%   BMI 22.03 kg/m  Gen: no distress, normal appearing HEENT: oral mucosa pink and moist, NCAT Cardio: Reg rate Chest: normal effort, normal rate of breathing Abd: soft, non-distended Ext: no edema Psych: pleasant, normal affect Skin: intact Neuro: Alert Cerebellar normal finger-nose-finger as well as toe to finger bilaterally HOH Motor: 5/5 throughout, stable  Assessment/Plan: 1. Functional deficits which require 3+ hours per day of interdisciplinary therapy in a comprehensive inpatient rehab setting.  Physiatrist is providing close team supervision and 24 hour management of active medical problems listed below.  Physiatrist and rehab team continue to assess barriers to discharge/monitor patient progress toward functional and medical goals   Care Tool:  Bathing    Body parts bathed by patient: Right arm,Left arm,Chest,Abdomen,Front perineal area,Buttocks,Right upper leg,Left upper leg,Right lower leg,Left lower leg,Face         Bathing assist Assist Level: Supervision/Verbal cueing     Upper  Body Dressing/Undressing Upper body dressing   What is the patient wearing?: Pull over shirt    Upper body assist Assist Level: Set up assist    Lower Body Dressing/Undressing Lower body dressing      What is the patient wearing?: Pants     Lower body assist Assist for lower body dressing: Supervision/Verbal cueing     Toileting Toileting    Toileting assist Assist for toileting: Supervision/Verbal cueing     Transfers Chair/bed transfer  Transfers assist     Chair/bed transfer assist level: Supervision/Verbal cueing     Locomotion Ambulation   Ambulation assist      Assist level: Minimal Assistance - Patient > 75% Assistive device: Cane-straight Max distance: 60'   Walk 10 feet activity   Assist     Assist level: Contact Guard/Touching assist Assistive device: Cane-straight   Walk 50 feet activity   Assist    Assist level: Minimal Assistance - Patient > 75% Assistive device: Cane-straight    Walk 150 feet activity   Assist Walk 150 feet activity did not occur: Safety/medical concerns  Assist level: Minimal Assistance - Patient > 75% Assistive device: Cane-straight    Walk 10 feet on uneven surface  activity   Assist Walk 10 feet on uneven surfaces activity did not occur: Safety/medical concerns         Wheelchair     Assist Will patient use wheelchair at discharge?: No Type of  Wheelchair: Chief of Staff assist level: Supervision/Verbal cueing Max wheelchair distance: 71'    Wheelchair 50 feet with 2 turns activity    Assist        Assist Level: Supervision/Verbal cueing   Wheelchair 150 feet activity     Assist      Assist Level: Moderate Assistance - Patient 50 - 74%    Medical Problem List and Plan: 1.  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  Continue CIR PT, OT 2.   Impaired mobility: -DVT/anticoagulation:  Pharmaceutical: Continue Lovenox CBC within normal limits on 4/11             -antiplatelet therapy: DAPT 3. Pain Management:  N/A 4. Mood: LCSW to follow up for evaluation and support.              -antipsychotic agents: N/A 5. Neuropsych: This patient is capable of making decisions on his own behalf. 6. Skin/Wound Care: Routine pressure relief measures  7. Fluids/Electrolytes/Nutrition: Monitor I/Os 8. HTN: Monitor BP tid             Norvasc 10mg  daily  Continue lisinopril 2.5mg , better controlled.    Vitals:   10/22/20 1943 10/23/20 0445  BP: (!) 171/59 135/70  Pulse: (!) 57 (!) 58  Resp: 18 16  Temp: (!) 97.4 F (36.3 C) 97.9 F (36.6 C)  SpO2: 100% 99%   9.  Dyslipidemia: Continue Lipitor 10. Vestibular symptoms: May need to pre-medicate prior to activity.             --Continue scopolamine patch              --Vestibular eval   Appears to be improving 11. Slow transit constipation:   Improving             Increased bowel meds as necessary 12.  Transaminitis  AST mildly elevated on 4/7, continue to monitor 13. Bradycardia: continue to monitor.  14. Disposition: d/c Friday. Lives with wife. Hospital follow-up with Dr. Monday scheduled on 11/14/20 9:20.  LOS: 6 days A FACE TO FACE EVALUATION WAS PERFORMED  01/14/21 10/23/2020, 12:53 PM

## 2020-10-23 NOTE — Progress Notes (Signed)
Occupational Therapy Session Note  Patient Details  Name: Charles Porter MRN: [id number] Date of Birth: Mar 23, 1936  Today's Date: 10/23/2020 OT Individual Time: 7494-4967   &   1500-1530 OT Individual Time Calculation (min): 60 min    &   30 min   Short Term Goals: Week 1:  OT Short Term Goal 1 (Week 1): STG =LTG dt ELOS  Skilled Therapeutic Interventions/Progress Updates:    AM session:   Patient seated in recliner, alert and ready for therapy session.  He denies pain.  Sit to stand and ambulation with RW to/from therapy gyms with CS/CGA.  Completed standing lateral low reach to placing objects on high surface on opposite side - completed in both directions at appropriate rate without LOB or c/o dizziness.  Completed design copy tasks x2 able to complete independently after initial instruction.  Practiced step in/out of tub with CGA, no LOB.  Reviewed set up of DME in bathroom setting.  He demonstrates good carryover and recall of prior sessions.  One LOB noted with turning in busy environment, min A to regain balance.  Returned to room, completed 2 trials of VOR exercise - patient quickly became nauseous and vomited a large amount.  Assisted with clean up and back to bed with CS.  Nursing aware, he states that he feels better but wants to rest his stomach, bed alarm set and call bell in reach.     PM session:   Patient seated in recliner, states that he ate some of his lunch and stomach is feeling better.  Sit to stand and ambulation with RW to/from toilet and therapy gym with CS.  toileting completed with CS.  Completed unsupported sitting UB exercises without difficulty.  Returned to recliner at close of session, he denies pain, dizziness or discomfort.  Chair alarm set and call bell in hand.       Therapy Documentation Precautions:  Precautions Precautions: Fall Precaution Comments: vertigo Restrictions Weight Bearing Restrictions: No   Therapy/Group: Individual Therapy  Barrie Lyme 10/23/2020, 7:31 AM

## 2020-10-23 NOTE — Progress Notes (Signed)
Physical Therapy Session Note  Patient Details  Name: Charles Porter MRN: [id number] Date of Birth: 1935/10/22  Today's Date: 10/23/2020 PT Individual Time: 0800-0900 PT Individual Time Calculation (min): 60 min   Short Term Goals: Week 1:  PT Short Term Goal 1 (Week 1): N/A due to ELOS  Skilled Therapeutic Interventions/Progress Updates:    Patient in supine without complaints.  Performed supine to sit with S and sit to stand to RW with S.  At sink to brush teeth, wash face and comb hair all with S leaning slightly on sink without UE support.  Patient ambulated with SPC to gym x 150'.  In parallel bars performed tandem gait with R UE support and CGA.  Crossing over and braiding with 1-2 UE support. In hallway on floor line performed tandem gait with cane and min A several passes.  Mild c/o queasiness passing with "belching" per patient.  Patient on floor mat forward focus for balance feet apart with S, then feet together 30 sec with S, then feet apart with head turns x 10 with close S to CGA.  Patient with mild increase in symptoms.  Demonstrated stairs with cane and rail with step to pattern.  Patient performed with close S with min cues for rail/cane management and no LOB x 16 steps.  Patient ambulated x 160' to room with cane and min A due to veering L at times and on turns, cues for stationary forward gaze.  Patient requesting to toilet. Performed with S and handwashing with S.  Discussed follow up with MD for continued medication/BP management for stroke prevention. Patient ambulated with CGA no device to his recliner.  Left with call bell and needs in reach, chair alarm active.   Therapy Documentation Precautions:  Precautions Precautions: Fall Precaution Comments: vertigo Restrictions Weight Bearing Restrictions: No Pain: Pain Assessment Pain Score: 0-No pain   Therapy/Group: Individual Therapy  [name]  [name], PT 10/23/2020, 8:25 AM

## 2020-10-24 MED ORDER — LISINOPRIL 5 MG PO TABS
5.0000 mg | ORAL_TABLET | Freq: Every day | ORAL | Status: DC
  Administered 2020-10-24 – 2020-10-25 (×2): 5 mg via ORAL
  Filled 2020-10-24 (×2): qty 1

## 2020-10-24 MED ORDER — LISINOPRIL 5 MG PO TABS: 5.0000 mg | ORAL_TABLET | Freq: Once | ORAL | Status: DC

## 2020-10-24 MED ORDER — SENNA 8.6 MG PO TABS
2.0000 | ORAL_TABLET | Freq: Every day | ORAL | Status: DC
  Administered 2020-10-24 – 2020-10-25 (×2): 17.2 mg via ORAL
  Filled 2020-10-24 (×2): qty 2

## 2020-10-24 MED ORDER — LISINOPRIL 5 MG PO TABS: 5.0000 mg | ORAL_TABLET | Freq: Every day | ORAL | Status: DC

## 2020-10-24 NOTE — Progress Notes (Signed)
Occupational Therapy Session Note  Patient Details  Name: Charles Porter MRN: [id number] Date of Birth: Aug 03, 1935  Today's Date: 10/24/2020 OT Individual Time: 0700-0756 OT Individual Time Calculation (min): 56 min    Short Term Goals: Week 1:  OT Short Term Goal 1 (Week 1): STG =LTG dt ELOS  Skilled Therapeutic Interventions/Progress Updates:     Pt received in bed asleep. Pt easily aroused already eaten dinner and without pain. Pt agreeable to shower.   ADL:  Pt completes bathing with supervision sit to stand with grab bar Pt completes UB dressing with set up Pt completes LB dressing with supervision sit to stand at sink. VC for narrower stance during sit to stand. Pt completes footwear with set up seated in w/c Pt completes toileting with supervision using RW with good 1UE support on RW Pt completes toileting transfer with RW and MIN A for  posterior LOB on crossing threshold into bathroom that is uphill. Depsite cuing/coaching on proximity to threshold and RW management pt places RW far out over threshold and takes step up onto ramp into bathroom losing balance backwards. Pt completes shower/Tub transfer with supervision using RW Pt grooms seated in w/c at sink with set up.   Therapeutic activity Pt practices threshold negotiation in bathroom, ramp functional mobility with RW, walking through mulch with RW and curb practice to simulate community reentry. OT demo RW management throughout and pt able to return demo with CGA and VC for closer proximity to RW prior to stepping up onto ramp/curb.   Pt left at end of session in recliner with exit alarm on, call light in reach and all needs met   Therapy Documentation Precautions:  Precautions Precautions: Fall Precaution Comments: vertigo Restrictions Weight Bearing Restrictions: No General:   Vital Signs: Therapy Vitals Temp: 98.3 F (36.8 C) Pulse Rate: (!) 53 Resp: 18 BP: 134/61 Patient Position (if appropriate):  Lying Oxygen Therapy SpO2: 100 % Pain:   ADL:   Vision   Perception    Praxis   Exercises:   Other Treatments:     Therapy/Group: Individual Therapy  Tonny Branch 10/24/2020, 7:00 AM

## 2020-10-24 NOTE — Progress Notes (Signed)
Charles Porter PHYSICAL MEDICINE & REHABILITATION PROGRESS NOTE  Subjective/Complaints: No complaints this morning Discussed his elevated blood pressures and that I will increase his lisinopril to 5mg . Provided dietary education.  Still bradycardic  ROS: denies CP, SOB, N/V/D  Objective: Vital Signs: Blood pressure 134/61, pulse (!) 53, temperature 98.3 F (36.8 C), resp. rate 18, height 5\' 6"  (1.676 m), weight 61.9 kg, SpO2 100 %. No results found. Recent Labs    10/22/20 0617  WBC 5.7  HGB 13.1  HCT 38.8*  PLT 278   Recent Labs    10/22/20 0617  NA 138  K 4.1  CL 106  CO2 26  GLUCOSE 94  BUN 20  CREATININE 1.19  CALCIUM 8.9    Intake/Output Summary (Last 24 hours) at 10/24/2020 1034 Last data filed at 10/24/2020 0700 Gross per 24 hour  Intake 640 ml  Output 625 ml  Net 15 ml        Physical Exam: BP 134/61   Pulse (!) 53   Temp 98.3 F (36.8 C)   Resp 18   Ht 5\' 6"  (1.676 m)   Wt 61.9 kg   SpO2 100%   BMI 22.03 kg/m  Gen: no distress, normal appearing HEENT: oral mucosa pink and moist, NCAT Cardio: Reg rate Chest: normal effort, normal rate of breathing Abd: soft, non-distended Ext: no edema Psych: pleasant, normal affect Skin: intact Neuro: Alert Cerebellar normal finger-nose-finger as well as toe to finger bilaterally HOH Motor: 5/5 throughout, stable  Assessment/Plan: 1. Functional deficits which require 3+ hours per day of interdisciplinary therapy in a comprehensive inpatient rehab setting.  Physiatrist is providing close team supervision and 24 hour management of active medical problems listed below.  Physiatrist and rehab team continue to assess barriers to discharge/monitor patient progress toward functional and medical goals   Care Tool:  Bathing    Body parts bathed by patient: Right arm,Left arm,Chest,Abdomen,Front perineal area,Buttocks,Right upper leg,Left upper leg,Right lower leg,Left lower leg,Face         Bathing  assist Assist Level: Supervision/Verbal cueing     Upper Body Dressing/Undressing Upper body dressing   What is the patient wearing?: Pull over shirt    Upper body assist Assist Level: Supervision/Verbal cueing    Lower Body Dressing/Undressing Lower body dressing      What is the patient wearing?: Pants,Underwear/pull up     Lower body assist Assist for lower body dressing: Supervision/Verbal cueing     Toileting Toileting    Toileting assist Assist for toileting: Supervision/Verbal cueing     Transfers Chair/bed transfer  Transfers assist     Chair/bed transfer assist level: Supervision/Verbal cueing     Locomotion Ambulation   Ambulation assist      Assist level: Contact Guard/Touching assist Assistive device: Cane-straight Max distance: 200+ ft   Walk 10 feet activity   Assist     Assist level: Contact Guard/Touching assist Assistive device: Cane-straight   Walk 50 feet activity   Assist    Assist level: Minimal Assistance - Patient > 75% Assistive device: Cane-straight    Walk 150 feet activity   Assist Walk 150 feet activity did not occur: Safety/medical concerns  Assist level: Minimal Assistance - Patient > 75% Assistive device: Cane-straight    Walk 10 feet on uneven surface  activity   Assist Walk 10 feet on uneven surfaces activity did not occur: Safety/medical concerns         Wheelchair     Assist Will patient use  wheelchair at discharge?: No Type of Wheelchair: Manual    Wheelchair assist level: Supervision/Verbal cueing Max wheelchair distance: 20'    Wheelchair 50 feet with 2 turns activity    Assist        Assist Level: Supervision/Verbal cueing   Wheelchair 150 feet activity     Assist      Assist Level: Moderate Assistance - Patient 50 - 74%    Medical Problem List and Plan: 1.  Dizziness with balance deficits secondary to patchy small volume acute infarcts left > right  cerebellar hemispheres and moderate chronic microvascular disease.  Continue CIR PT, OT 2.  Impaired mobility: -DVT/anticoagulation:  Pharmaceutical: d/c Lovenox since ambulating >150 feet CBC within normal limits on 4/11             -antiplatelet therapy: DAPT 3. Pain Management:  N/A 4. Mood: LCSW to follow up for evaluation and support.              -antipsychotic agents: N/A 5. Neuropsych: This patient is capable of making decisions on his own behalf. 6. Skin/Wound Care: Routine pressure relief measures  7. Fluids/Electrolytes/Nutrition: Monitor I/Os 8. HTN: Monitor BP tid             Norvasc 10mg  daily  Increase lisinopril to 5mg    Vitals:   10/23/20 1949 10/24/20 0418  BP: (!) 170/70 134/61  Pulse: (!) 53 (!) 53  Resp: 17 18  Temp: 97.8 F (36.6 C) 98.3 F (36.8 C)  SpO2: 100% 100%   9.  Dyslipidemia: Continue Lipitor 10. Vestibular symptoms: May need to pre-medicate prior to activity.             --Continue scopolamine patch              --Vestibular eval   Appears to be improving 11. Slow transit constipation:   Improving             Increased bowel meds as necessary 12.  Transaminitis  AST mildly elevated on 4/7, continue to monitor 13. Bradycardia: continue to monitor. 14. Prediabetes HgbA1c 6.2 on 10/14/20: provided dietary education, repeat in July  15. Disposition: d/c Friday. Lives with wife. Hospital follow-up with Dr. 10-19-1977 scheduled on 11/14/20 9:20.  LOS: 7 days A FACE TO FACE EVALUATION WAS PERFORMED  Charles Porter Charles Porter 10/24/2020, 10:34 AM

## 2020-10-24 NOTE — Progress Notes (Signed)
Blood pressures elevated in evenings--dropped his dose this am. Will reorder lisinopril 5 mg daily with supper.

## 2020-10-24 NOTE — Progress Notes (Signed)
Occupational Therapy Session Note  Patient Details  Name: Charles Porter MRN: [id number] Date of Birth: [birth year]  Today's Date: 10/24/2020 OT Individual Time: 1449-1530 OT Individual Time Calculation (min): 41 min    Short Term Goals: Week 1:  OT Short Term Goal 1 (Week 1): STG =LTG dt ELOS  Skilled Therapeutic Interventions/Progress Updates:    Pt in bed to start session.  He was able to complete transfer to sitting with supervision.  No report of dizziness with transition.  Had him complete functional mobility down to the dayroom with min guard assist and no device.  He was able to work on gaze stabilization in sitting for both horizontal and vertical head movements.  He reported slight dizziness after completion but not significant.  Had him work on visual tracking with ball toss and catch in sitting.  He was able to track it efficiently and catch and toss it with 90% accuracy.  He then worked on [employer] activity in standing including walking over and picking them up from the floor and the board.  Min assist needed on two occasions secondary to LOB forward and to the right when reaching down.  No significant dizziness noted.  Finished session with ambulation back to the room for completion of session with min guard assist.  Pt left in the bed per his request with call button and phone in reach and safety alarm in place.    Therapy Documentation Precautions:  Precautions Precautions: Fall Precaution Comments: vertigo Restrictions Weight Bearing Restrictions: No   Vital Signs: Therapy Vitals Temp: 97.7 F (36.5 C) Temp Source: Oral Pulse Rate: (!) 55 Resp: 18 BP: (!) 151/56 Patient Position (if appropriate): Lying Oxygen Therapy SpO2: 100 % O2 Device: Room Air Pain: Pain Assessment Pain Scale: 0-10 Pain Score: 0-No pain ADL: See Care Tool Section for some details of mobility and selfcare  Therapy/Group: Individual Therapy  Jayesh Marbach OTR/L 10/24/2020, 4:06 PM

## 2020-10-24 NOTE — Progress Notes (Signed)
PA Pam notified of missed lisinopril 2.5 mg dose this AM. New orders received. See vitals flowsheet for recent blood pressure. No complications noted with pt/pt vitals Mylo Red, LPN  Mylo Red, LPN

## 2020-10-24 NOTE — Progress Notes (Signed)
Physical Therapy Session Note  Patient Details  Name: Charles Porter MRN: [id number] Date of Birth: 1935/11/10  Today's Date: 10/24/2020 PT Individual Time:Session1: 0903-1000; Session2: 1100-1130 PT Individual Time Calculation (min): 57 min 30 min  Short Term Goals: Week 1:  PT Short Term Goal 1 (Week 1): N/A due to ELOS  Skilled Therapeutic Interventions/Progress Updates:    Session1:  Patient in recliner and reports feeling okay, but had dreams that affected sleep.  Performed sit to stand with S.  Ambulated with SPC x 120' to dayroom.  Through obstacles course stepping over and around obstacles with cane and CGA throughout. Patient performed standing balance with single limb support and cane to roll basketball under R then L foot 10x CW then CCW with CGA.  Patient standing to toss ball to self without UE support after demonstration with close S.  Patient standing to give/get ball from R vs L with eyes and head following ball with close S.  Patient standing to toss/catch ball with chair right behind for safety with overhand, under hand and bounce pass.  Patient ambulated with SPC x 37' with CGA turning head to ID playing cards noted veering R/L more and L more than R.  Performed two more rounds with cues for stationary target when not looking at cards with decreased episodes of veering, still with CGA.  Patient ambulated 160' and negotiated 10 steps in stairwell with cane and R rail with CGA increased time, performing step to sequence with cues.  Patient reported some queasiness/wooziness with stairs in stair well. Patient ambulated to room x 70' with cane and CGA to S and left in recliner with call bell in reach and chair alarm on.   Session2:  Patient in reliner and reports no issues.  Performed sit to stand slowly with S and using cane to ambulate to therapy gym x 160' with CGA to S.  Patient performing standing gaze stabilization exercises with cues for target maintenance, head speed using cane  for support and close S with target 5' away.  Performed 20-25 head turns/nods close to 45 sec.  Performed again after seated rest.  Discussed performing at home standing next to a counter with one upper extrimity support and target on wall 5' away.  Patient with questions regarding vertigo from peripheral hypofunction versus from stroke or (high blood pressure).  Patient states MD educating about diet control of BP as well.  Patient ambulated to room 160' with cane and close S to CGA.  Toileted with S and washed hands at sink. Left in recliner with call bell in reach and chair alarm on.   Therapy Documentation Precautions:  Precautions Precautions: Fall Precaution Comments: vertigo Restrictions Weight Bearing Restrictions: No Pain: Pain Assessment Pain Scale: 0-10 Pain Score: 0-No pain   Therapy/Group: Individual Therapy  [name]  [name], PT 10/24/2020, 9:26 AM

## 2020-10-24 NOTE — Patient Care Conference (Signed)
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 Sula Soda Care Coodinator Present: Dossie Der, LCSW;Levada Bowersox Lambert Mody, RN, BSN, CRRN Nurse Present: Chana Bode, RN PT Present: [name], PT OT Present: Earleen Newport, OT PPS Coordinator present : Fae Pippin, SLP     Current Status/Progress Goal Weekly Team Focus  Bowel/Bladder             Swallow/Nutrition/ Hydration             ADL's   CS/set up for shower/dressing and grooming tasks, occ CGA for functional mobility/transfers  set up/mod I  adl, transfer training, balance, family education   Mobility   S to CGA with ambulation with RW, 16 steps with rail and cane S, bed mobility, transfers S  S overall  Balance, gait, vestibular adaptation/habituation, stroke education, safety   Communication             Safety/Cognition/ Behavioral Observations            Pain             Skin               Discharge Planning:  Home with wife who can be there with him but not provide care due to own health issues. Pt set up with OP therapies.   Team Discussion: MD adjusted medications for hypertension. Patient on target to meet rehab goals: yes, currently supervision for ambulation but veers with head turns. Able to manage 16 steps with supervision. Supervision goals set for discharge.  *See Care Plan and progress notes  for long and short-term goals.   Revisions to Treatment Plan:  HEP for vestibular symptom management  Teaching Needs: Safety, set up assistance, medication, secondary stroke risk management, etc.   Current Barriers to Discharge: Decreased caregiver support and Home enviroment access/layout  Wife with health issues, supervision assist only  Possible Resolutions to Barriers: OP therapy follow up services DME including walker and shower seat recommended     Medical Summary Current Status: elevated BP, bradycardia to 53, vestibular symptoms  Barriers to Discharge: Medical stability  Barriers to Discharge Comments: elevated BP, bradycardia to 53, vestibular symptoms Possible Resolutions to [employer] Focus: increase lisinopril to 5mg , continue to mornitor HR, continue scopolamine patch, provided dietary education   Continued Need for Acute Rehabilitation Level of Care: The patient requires daily medical management by a physician with specialized training in physical medicine and rehabilitation for the following reasons: Direction of a multidisciplinary physical rehabilitation program to maximize functional independence : Yes Medical management of patient stability for increased activity during participation in an intensive rehabilitation regime.: Yes Analysis of laboratory values and/or radiology reports with any subsequent need for medication adjustment and/or medical intervention. : Yes   I attest that I was present, lead the team conference, and concur with the assessment and  plan of the team.   Pamelia Hoit 10/24/2020, 2:28 PM

## 2020-10-25 NOTE — Progress Notes (Signed)
Occupational Therapy Session Note  Patient Details  Name: Charles Porter MRN: [id number] Date of Birth: 29-Apr-1936  Today's Date: 10/25/2020 OT Individual Time: 1324-4010 OT Individual Time Calculation (min): 41 min    Short Term Goals: Week 1:  OT Short Term Goal 1 (Week 1): STG =LTG dt ELOS  Skilled Therapeutic Interventions/Progress Updates:    OT intervention with focus on bed mobility, functional amb with RW, standing balance, bathing at shower level and dressing with sit<stand from TTB, toileting, and safety awareness to prepare for discharge home tomorrow. Pt completed all tasks at supervision level. Amb with RW at supervision level including maneuvering bathroom doorway. Pt required min verbal cues to walk "into" walker. Pt completed grooming tasks standing at sink. Pt returned to recliner. Pt remained in relciner. Seat alarm activated and all needs within reach. Pt pleased with progress and ready for discharge home tomorrow.   Therapy Documentation Precautions:  Precautions Precautions: Fall Precaution Comments: vertigo Restrictions Weight Bearing Restrictions: No  Pain:  Pt denies pain this morning   Therapy/Group: Individual Therapy  Rich Brave 10/25/2020, 9:28 AM

## 2020-10-25 NOTE — Plan of Care (Signed)
  Problem: RH Balance Goal: LTG Patient will maintain dynamic sitting balance (PT) Description: LTG:  Patient will maintain dynamic sitting balance with assistance during mobility activities (PT) Outcome: Completed/Met Goal: LTG Patient will maintain dynamic standing balance (PT) Description: LTG:  Patient will maintain dynamic standing balance with assistance during mobility activities (PT) Outcome: Completed/Met   Problem: Sit to Stand Goal: LTG:  Patient will perform sit to stand with assistance level (PT) Description: LTG:  Patient will perform sit to stand with assistance level (PT) Outcome: Completed/Met   Problem: RH Bed Mobility Goal: LTG Patient will perform bed mobility with assist (PT) Description: LTG: Patient will perform bed mobility with assistance, with/without cues (PT). Outcome: Completed/Met   Problem: RH Bed to Chair Transfers Goal: LTG Patient will perform bed/chair transfers w/assist (PT) Description: LTG: Patient will perform bed to chair transfers with assistance (PT). Outcome: Completed/Met   Problem: RH Car Transfers Goal: LTG Patient will perform car transfers with assist (PT) Description: LTG: Patient will perform car transfers with assistance (PT). Outcome: Not Met (add Reason) due to stepping in first, needed cues and CGA to sit first prior to getting feet in   Problem: RH Ambulation Goal: LTG Patient will ambulate in controlled environment (PT) Description: LTG: Patient will ambulate in a controlled environment, # of feet with assistance (PT). Outcome: Completed/Met Goal: LTG Patient will ambulate in home environment (PT) Description: LTG: Patient will ambulate in home environment, # of feet with assistance (PT). Outcome: Completed/Met   Problem: RH Wheelchair Mobility Goal: LTG Patient will propel w/c in controlled environment (PT) Description: LTG: Patient will propel wheelchair in controlled environment, # of feet with assist (PT) Outcome:  Completed/Met   Problem: RH Stairs Goal: LTG Patient will ambulate up and down stairs w/assist (PT) Description: LTG: Patient will ambulate up and down # of stairs with assistance (PT) Outcome: Completed/Met  Magda Kiel, PT

## 2020-10-25 NOTE — Progress Notes (Signed)
Physical Therapy Discharge Summary  Patient Details  Name: Charles Porter MRN: [id number] Date of Birth: [birth year]  Today's Date: 10/25/2020  Patient has met 9 of 10 long term goals due to improved activity tolerance, improved balance, increased strength and ability to compensate for deficits.  Patient to discharge at an ambulatory level Supervision.   Patient's care partner is independent to provide the necessary physical assistance at discharge.  Reasons goals not met: Patient needing CGA for car transfers due to stepping in with foot prior to sitting, educated to sit prior to placing feet in car.   Recommendation:  Patient will benefit from ongoing skilled PT services in outpatient setting to continue to advance safe functional mobility, address ongoing impairments in balance, vestibular adaptation, and minimize fall risk.  Equipment: RW  Reasons for discharge: treatment goals met and discharge from hospital  Patient/family agrees with progress made and goals achieved: Yes  PT Discharge Precautions/Restrictions Precautions Precautions: Fall Pain Pain Assessment Pain Score: 0-No pain Vision/Perception  Perception Perception: Within Functional Limits Praxis Praxis: Intact  Cognition Overall Cognitive Status: Within Functional Limits for tasks assessed Arousal/Alertness: Awake/alert Problem Solving: Appears intact Safety/Judgment: Appears intact Sensation Sensation Light Touch: Appears Intact Hot/Cold: Not tested Proprioception: Not tested Stereognosis: Not tested Coordination Gross Motor Movements are Fluid and Coordinated: Yes Fine Motor Movements are Fluid and Coordinated: Yes Motor  Motor Motor: Within Functional Limits Motor - Discharge Observations: Continued mild balance deficits, but coordination and motor intact  Mobility Bed Mobility Bed Mobility: Sit to Supine;Supine to Sit Supine to Sit: Independent Sit to Supine: Independent Transfers Transfers:  Sit to Stand;Stand to Sit Sit to Stand: Supervision/Verbal cueing Stand to Sit: Supervision/Verbal cueing Transfer (Assistive device): Rolling walker (versus SPC) Locomotion  Gait Gait Assistance: Supervision/Verbal cueing Gait Distance (Feet): 150 Feet Assistive device: Rolling walker Gait Gait: Yes Gait Pattern: Step-through pattern;Decreased stride length Stairs / Additional Locomotion Stairs: Yes Stairs Assistance: Supervision/Verbal cueing Stair Management Technique: Step to pattern;Forwards;One rail Right;With cane Number of Stairs: 14 Height of Stairs: 6 Ramp: Contact Guard/touching assist Curb: Engineer, maintenance (IT) Mobility: Yes Wheelchair Assistance: Chartered loss adjuster: Both upper extremities Wheelchair Parts Management: Supervision/cueing Distance: 100  Trunk/Postural Assessment  Cervical Assessment Cervical Assessment: Exceptions to [facility] (forward head) Thoracic Assessment Thoracic Assessment: Exceptions to [facility] (rounded shoulders) Lumbar Assessment Lumbar Assessment: Exceptions to [facility] (posterior pelvic tilt) Postural Control Postural Control: Within Functional Limits  Balance Balance Balance Assessed: Yes Dynamic Sitting Balance Dynamic Sitting - Balance Support: During functional activity Dynamic Sitting - Level of Assistance: 5: Stand by assistance Dynamic Standing Balance Dynamic Standing - Balance Support: No upper extremity supported Dynamic Standing - Level of Assistance: 5: Stand by assistance Dynamic Standing - Balance Activities: Ball toss Extremity Assessment      RLE Assessment RLE Assessment: Within Functional Limits LLE Assessment LLE Assessment: Within Functional Limits    Reginia Naas  Magda Kiel, PT 10/25/2020, 5:00 PM

## 2020-10-25 NOTE — Progress Notes (Signed)
Occupational Therapy Discharge Summary  Patient Details  Name: Charles Porter MRN: [id number] Date of Birth: March 21, 1936  Patient has met 9 of 9 long term goals due to improved activity tolerance, improved balance, postural control, ability to compensate for deficits, functional use of  LEFT upper and LEFT lower extremity, improved attention, improved awareness and improved coordination. Pt made steady progress with BADLS and functional transfers/amb with RW. Pt completes all tasks with supervision. Pt continues to experience vestibular disturbances with head turns but recalls compensatory strategies.   Patient to discharge at overall Supervision level.  Patient's care partner is independent to provide the necessary supervision assistance at discharge.    Recommendation:  Patient will benefit from ongoing skilled OT services in outpatient setting to continue to advance functional skills in the area of BADL and iADL.  Equipment: No equipment provided Information provided for TTB/shower seat  Reasons for discharge: treatment goals met and discharge from hospital  Patient/family agrees with progress made and goals achieved: Yes  OT Discharge Vision Baseline Vision/History: Wears glasses Wears Glasses: Reading only Patient Visual Report: No change from baseline Vision Assessment?: No apparent visual deficits Perception  Perception: Within Functional Limits Praxis Praxis: Intact Cognition Overall Cognitive Status: Within Functional Limits for tasks assessed Arousal/Alertness: Awake/alert Orientation Level: Oriented X4 Attention: Focused;Sustained Focused Attention: Appears intact Sustained Attention: Appears intact Memory: Appears intact Awareness: Appears intact Problem Solving: Appears intact Safety/Judgment: Appears intact Sensation Sensation Light Touch: Appears Intact Hot/Cold: Appears Intact Proprioception: Appears Intact Stereognosis: Not tested Coordination Gross Motor  Movements are Fluid and Coordinated: Yes Fine Motor Movements are Fluid and Coordinated: Yes Motor  Motor Motor: Within Functional Limits     Trunk/Postural Assessment  Cervical Assessment Cervical Assessment:  (forward head) Thoracic Assessment Thoracic Assessment:  (rounded shoulders) Lumbar Assessment Lumbar Assessment:  (posterior pelvic tilt) Postural Control Postural Control: Within Functional Limits  Balance Dynamic Sitting Balance Dynamic Sitting - Balance Support: During functional activity Dynamic Sitting - Level of Assistance: 5: Stand by assistance Extremity/Trunk Assessment RUE Assessment RUE Assessment: Within Functional Limits LUE Assessment LUE Assessment: Within Functional Limits   Leroy Libman 10/25/2020, 9:31 AM

## 2020-10-25 NOTE — Discharge Instructions (Signed)
Inpatient Rehab Discharge Instructions  [facility] Discharge date and time: 10/26/20   Activities/Precautions/ Functional Status: Activity: no lifting, driving, or strenuous exercise till cleared by MD Diet: cardiac diet/diabetic diet Wound Care: none needed    Functional status:  ___ No restrictions     ___ Walk up steps independently _X__ 24/7 supervision/assistance   ___ Walk up steps with assistance ___ Intermittent supervision/assistance  ___ Bathe/dress independently ___ Walk with walker     ___ Bathe/dress with assistance ___ Walk Independently    ___ Shower independently ___ Walk with assistance    _X__ Shower with assistance _X__ No alcohol     ___ Return to work/school ________  Special Instructions:    REFERRALS UPON DISCHARGE:   Outpatient: PT & OT             Agency:[facility]  [phone]              Appointment Date/Time:WILL CALL YOU TO SET UP APPOINTMENTS  Medical Equipment/Items Ordered:ROLLING WALKER & 3 IN 1                                                 Agency/Supplier:[facility]  [phone]    STROKE/TIA DISCHARGE INSTRUCTIONS SMOKING Cigarette smoking nearly doubles your risk of having a stroke & is the single most alterable risk factor  If you smoke or have smoked in the last 12 months, you are advised to quit smoking for your health.  Most of the excess cardiovascular risk related to smoking disappears within a year of stopping.  Ask you doctor about anti-smoking medications  [place] Quit Line: 1-800-QUIT NOW  Free Smoking Cessation Classes [phone]  CHOLESTEROL Know your levels; limit fat & cholesterol in your diet  Lipid Panel     Component Value Date/Time   CHOL 245 (H) 10/14/2020 0105   TRIG 74 10/14/2020 0105   HDL 65 10/14/2020 0105   CHOLHDL 3.8 10/14/2020 0105   VLDL 15 10/14/2020 0105   LDLCALC 165 (H) 10/14/2020 0105      Many patients benefit from treatment even if their cholesterol is at  goal.  Goal: Total Cholesterol (CHOL) less than 160  Goal:  Triglycerides (TRIG) less than 150  Goal:  HDL greater than 40  Goal:  LDL (LDLCALC) less than 100   BLOOD PRESSURE American Stroke Association blood pressure target is less that 120/80 mm/Hg  Your discharge blood pressure is:  BP: (!) 139/49  Monitor your blood pressure  Limit your salt and alcohol intake  Many individuals will require more than one medication for high blood pressure  DIABETES (A1c is a blood sugar average for last 3 months) Goal HGBA1c is under 7% (HBGA1c is blood sugar average for last 3 months)  Diabetes: Pre-diabetic     Lab Results  Component Value Date   HGBA1C 6.2 (H) 10/14/2020     Your HGBA1c can be lowered with medications, healthy diet, and exercise.  Check your blood sugar as directed by your physician  Call your physician if you experience unexplained or low blood sugars.  PHYSICAL ACTIVITY/REHABILITATION Goal is 30 minutes at least 4 days per week  Activity: No driving, Therapies: see above Return to work: N/A  Activity decreases your risk of heart attack and stroke and makes your heart stronger.  It helps control your weight  and blood pressure; helps you relax and can improve your mood.  Participate in a regular exercise program.  Talk with your doctor about the best form of exercise for you (dancing, walking, swimming, cycling).  DIET/WEIGHT Goal is to maintain a healthy weight  Your discharge diet is:  Diet Order            Diet Heart Room service appropriate? Yes; Fluid consistency: Thin  Diet effective now                liquids Your height is:  Height: 5\' 6"  (167.6 cm) Your current weight is: Weight: 61.9 kg Your Body Mass Index (BMI) is:  BMI (Calculated): 22.04  Following the type of diet specifically designed for you will help prevent another stroke.  You are at goal weight   Your goal Body Mass Index (BMI) is 19-24.  Healthy food habits can help reduce 3  risk factors for stroke:  High cholesterol, hypertension, and excess weight.  RESOURCES Stroke/Support Group:  Call [phone]   STROKE EDUCATION PROVIDED/REVIEWED AND GIVEN TO PATIENT Stroke warning signs and symptoms How to activate emergency medical system (call 911). Medications prescribed at discharge. Need for follow-up after discharge. Personal risk factors for stroke. Pneumonia vaccine given:  Flu vaccine given:  My questions have been answered, the writing is legible, and I understand these instructions.  I will adhere to these goals & educational materials that have been provided to me after my discharge from the hospital.     My questions have been answered and I understand these instructions. I will adhere to these goals and the provided educational materials after my discharge from the hospital.  Patient/Caregiver Signature _______________________________ Date __________  Clinician Signature _______________________________________ Date __________  Please bring this form and your medication list with you to all your follow-up doctor's appointments. COMMUNITY

## 2020-10-25 NOTE — Progress Notes (Signed)
Patient ID: Charles Porter, male   DOB: 09-23-1935, 85 y.o.   MRN: [id number]  Met with pt to discuss tub seat and 3 in 1. He wants the 3 in 1 and will wait on tub seat since not covered by insurance. Aware discharge tomorrow,as long as BP are stable. Prepare for DC tomorrow.

## 2020-10-25 NOTE — Progress Notes (Signed)
Inpatient Rehabilitation Care Coordinator Discharge Note  The overall goal for the admission was met for:   Discharge location: Yes-HOME WITH WIFE WHO CAN PROVIDE SUPERVISION LEVEL  Length of Stay: Yes-9 DAYS  Discharge activity level: Yes-SUPERVISION   Home/community participation: Yes  Services provided included: MD, RD, PT, OT, RN, CM, Pharmacy and SW  Financial Services: Medicare and Private Insurance: [place] offered to/list presented to:YES  Follow-up services arranged: Outpatient: [facility]-PT WILL CALL TO SET UP APPOINTMENTS, DME: [facility]-ROLLING WALKER & 3 IN 1 and Patient/Family has no preference for HH/DME agencies  Comments (or additional information):PT DID WELL BUT CONTINUED TO HAVE BP ISSUES WHICH LIMITED HIM IN THERAPIES. WIFE CAN PROVIDE SUPERVISION LEVEL DUE TO BACK ISSUES  Patient/Family verbalized understanding of follow-up arrangements: Yes  Individual responsible for coordination of the follow-up plan: JANET-WIFE [phone]  Confirmed correct DME delivered: Elease Hashimoto 10/25/2020    Waqas Bruhl, Gardiner Rhyme

## 2020-10-25 NOTE — Progress Notes (Signed)
Physical Therapy Session Note  Patient Details  Name: Charles Porter MRN: [id number] Date of Birth: 10/25/1935  Today's Date: 10/25/2020 PT Individual Time: 1130-1200 and 1300-1413 and 1517-1600 PT Individual Time Calculation (min): 30 min and 73 min and 43 min  Short Term Goals: Week 1:  PT Short Term Goal 1 (Week 1): N/A due to ELOS  Skilled Therapeutic Interventions/Progress Updates:   First session:  Pt presents reclining in recliner and agreeable to participate in therapy.  Pt amb w/ SPC and close supervision up to 120' and verbal cues for visual scanning and posture.  Pt amb multiple trials w/o LOB, but cueing for safe approach to seat including BUEs reaching for seat.  Pt performed multiple obstacle courses around cones w/ unsteadiness noted x 1 instance.  Pt requires occasional cueing for turns.  Pt returned to recliner w/ seat alarm on and all needs in reach.  Second session:  Pt presents sitting in recliner and agreeable to therapy.  Pt amb multiple trials w/ SPC and close supervision.  Pt amb 150' per trial w/ seated rest break required between trials.  Pt negotiated 4" step w/ SPC and supervision as pt states entry to house from garage is this height.  Pt negotiated 12 steps w/ 1 handrail and SPC and supervision w/ a step-to gait pattern.  Pt performed standing reaching and hooking horseshoes over basketball hoop including crossing midline and using B hands simultaneously.  Pt performed standing bouncing and throwing large T-ball overhead and chest height w/o LOB.  Pt amb multiple trials "bear-hugging" large yellow T-ball w/o LOB, except x 1 w/ turn.  Pt returned to room and reclining in recliner, seat alarm on and all needs in reach.  Third session:  Pt presents asleep in recliner but arouses and participates in therapy.  Pt amb multiple trials w/ SPC and supervision up to 150'.  Pt performed car transfer w/ CG touching, as pt attempts to stand on 1 foot to enter car.  Education given  of safety needs of sitting before bringing legs into car.  Pt amb up ramp and across mulch w/ supervision, but increased cues for safety.  Pt also amb 175' w/ RW and supervision as pt D/Cing to home w/ RW for initial gait w/ family.  Pt performed multiple stepping into and over hula hoops, interchanging feet.  Pt amb to toilet and performed all clothing management and pericare w/ continent BM and urine.  NT notified.  Pt returned to recliner and seat alarm on w/ all needs in reach.     Therapy Documentation Precautions:  Precautions Precautions: Fall Precaution Comments: vertigo Restrictions Weight Bearing Restrictions: No General:   Vital Signs:  Pain:0/10 Pain Assessment Pain Scale: 0-10 Pain Score: 0-No pain Mobility:   Locomotion :    Trunk/Postural Assessment : Cervical Assessment Cervical Assessment:  (forward head) Thoracic Assessment Thoracic Assessment:  (rounded shoulders) Lumbar Assessment Lumbar Assessment:  (posterior pelvic tilt) Postural Control Postural Control: Within Functional Limits  Balance: Dynamic Sitting Balance Dynamic Sitting - Balance Support: During functional activity Dynamic Sitting - Level of Assistance: 5: Stand by assistance Exercises:   Other Treatments:      Therapy/Group: Individual Therapy  Lucio Edward 10/25/2020, 1:03 PM

## 2020-10-26 ENCOUNTER — Other Ambulatory Visit: Payer: Self-pay

## 2020-10-26 MED ORDER — AMLODIPINE BESYLATE 10 MG PO TABS: 10.0000 mg | ORAL_TABLET | Freq: Every day | ORAL | 0 refills | Status: DC

## 2020-10-26 MED ORDER — SCOPOLAMINE 1 MG/3DAYS TD PT72: 1.0000 | MEDICATED_PATCH | TRANSDERMAL | 0 refills | Status: DC

## 2020-10-26 MED ORDER — ATORVASTATIN CALCIUM 80 MG PO TABS: 80.0000 mg | ORAL_TABLET | Freq: Every day | ORAL | 0 refills | Status: DC

## 2020-10-26 MED ORDER — SENNA 8.6 MG PO TABS: 2.0000 | ORAL_TABLET | Freq: Every day | ORAL | 0 refills | Status: AC

## 2020-10-26 MED ORDER — CLOPIDOGREL BISULFATE 75 MG PO TABS: 75.0000 mg | ORAL_TABLET | Freq: Every day | ORAL | 2 refills | Status: DC

## 2020-10-26 MED ORDER — LORATADINE 10 MG PO TABS: 10.0000 mg | ORAL_TABLET | Freq: Every day | ORAL | 2 refills | Status: DC

## 2020-10-26 MED ORDER — LISINOPRIL 5 MG PO TABS: 5.0000 mg | ORAL_TABLET | Freq: Every day | ORAL | 0 refills | Status: DC

## 2020-10-26 MED ORDER — POLYETHYLENE GLYCOL 3350 17 G PO PACK: 17.0000 g | PACK | Freq: Every day | ORAL | 0 refills | Status: DC

## 2020-10-26 MED ORDER — CARBAMIDE PEROXIDE 6.5 % OT SOLN
5.0000 [drp] | Freq: Once | OTIC | Status: AC
  Administered 2020-10-26: 5 [drp] via OTIC
  Filled 2020-10-26: qty 15

## 2020-10-26 NOTE — Discharge Summary (Signed)
Physician Discharge Summary  Patient ID: Charles Porter MRN: [id number] DOB/AGE: Feb 01, 1936 85 y.o.  Admit date: 10/17/2020 Discharge date: 10/26/2020  Discharge Diagnoses:  Principal Problem:   Cerebellar stroke [facility]) Active Problems:   Dyslipidemia   Essential hypertension   Transaminitis   Vertigo   Thyroid nodule   Lung nodule seen on imaging study   Discharged Condition: stable   Significant Diagnostic Studies: N/A   Labs:  Basic Metabolic Panel:  BMP Latest Ref Rng & Units 10/22/2020 10/18/2020 10/17/2020  Glucose 70 - 99 mg/dL 94 510(C) 96  BUN 8 - 23 mg/dL 20 21 22   Creatinine 0.61 - 1.24 mg/dL 5.85 2.77  Sodium 135 - 145 mmol/L 138 139 138  Potassium 3.5 - 5.1 mmol/L 4.1 3.7 3.8  Chloride 98 - 111 mmol/L 106 105 105  CO2 22 - 32 mmol/L 26 26 27   Calcium 8.9 - 10.3 mg/dL 8.9 9.1 9.1    CBC: CBC Latest Ref Rng & Units 10/22/2020 10/18/2020 10/17/2020  WBC 4.0 - 10.5 K/uL 5.7 5.6 7.0  Hemoglobin 13.0 - 17.0 g/dL 12/18/2020 12/17/2020 23.5  Hematocrit 39.0 - 52.0 % 38.8(L) 45.2 45.1  Platelets 150 - 400 K/uL 278 314 293    CBG: No results for input(s): GLUCAP in the last 168 hours.  Brief HPI:   Charles Porter is a 85 y.o. male in relatively good health with no medical care for years; who was admitted on 10/14/2018 with 4-day history of dizziness, feeling off balance and was found to have malignant hypertension with systolic blood pressures in the 240s.  He was treated with hydralazine with significant drop and onset of dysarthria and left facial droop.  MRI brain done revealing patchy small volume acute infarcts left greater than right cerebellar hemispheres and moderate microvascular disease.  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  2D echo showed EF 60 to 65% with  severe concentric LVH.   Dr.Xu felt the stroke was likely embolic due to VA stenosis/occlusion but need to rule out cardioembolic source.  SBP goal 1 4150 range given bilateral VA stenosis and outpatient follow-up.97  Neurology recommended ASA 325 and Plavix x3 months followed by aspirin alone as well as 30-day cardiac event monitor after discharge.  Patient continued to be limited by dizziness with balance deficits as well as significant BP elevation with activity affecting overall functional status.  CIR recommended due to functional decline.   Hospital Course: Charles Porter was admitted to rehab 10/17/2020 for inpatient therapies to consist of PT, ST and OT at least three hours five days a week. Past admission physiatrist, therapy team and rehab RN have worked together to provide customized collaborative inpatient rehab. scopolamine patch was added to help manage vestibular symptoms and has been effective. His blood pressures were monitored on TID basis and has been ranging from 130-150 on current dose of Norvasc. Check of electrolytes showed sodium and renal status to be WNL.   He continues on DAPT and is tolerating this without SE.  WBC and platelets are stable but he has had mild drop in H/H without signs of bleeding. Mild elevation in AST continues and recommend follow up labs in a couple weeks to monitor for trend. His activity tolerance and vestibular symptoms have improved and he  has progressed to supervision level.  Cardiology was contacted regarding today and monitor and plans to contact patient on outpatient basis.  He will continue to receive follow up outpatient PT at [facility] after discharge.    Rehab course: During patient's stay in rehab weekly team conferences were held to monitor patient's progress, set goals and discuss barriers to discharge. At admission, patient required min assist with basic self care tasks and with  Mobility.  Speech therapy showed that cognitive  linguistic abilities were grossly intact and no ST needed during his stay.  He  has had improvement in activity tolerance, balance, postural control as well as ability to compensate for deficits.  He requires supervision with transfers and to ambulate 150' with RW. Family education has been completed.   Disposition: Home  Diet: Heart healthy.   Special Instructions: 1. NO driving or return to work  till cleared by Neurology.  2. Repeat CBC/LFTs in a couple of 1-2 weeks.  3.  Will need work-up to follow-up thyroid nodule as well as 4 mm LLL nodule. 4.  Systolic blood pressure goal 130-150 range per neurology recommendations. 5.  Cardiology to contact patient regarding 30-day event monitor.   Discharge Instructions    Ambulatory referral to Cardiology   Complete by: As directed    Needs 30 day event monitor for stroke work up. Discharge set for Friday 04/15   Ambulatory referral to Physical Medicine Rehab   Complete by: As directed    3-4 weeks follow up appt     Allergies as of 10/26/2020   No Known Allergies     Medication List    TAKE these medications   amLODipine 10 MG tablet Commonly known as: NORVASC Take 1 tablet (10 mg total) by mouth daily.   aspirin 325 MG tablet Take 1 tablet (325 mg total) by mouth daily.   atorvastatin 80 MG tablet Commonly known as: LIPITOR Take 1 tablet (80 mg total) by mouth daily.   clopidogrel 75 MG tablet Commonly known as: PLAVIX Take 1 tablet (75 mg total) by mouth daily.   docusate sodium 100 MG capsule Commonly known as: COLACE Take 1 capsule (100 mg total) by mouth 2 (two) times daily.   lisinopril 5 MG tablet Commonly known as: ZESTRIL Take 1 tablet (5 mg total) by mouth daily with supper.   loratadine 10 MG tablet Commonly known as: Claritin Take 1 tablet (10 mg total) by mouth daily.   polyethylene glycol 17 g packet Commonly known as: MIRALAX / GLYCOLAX Take 17 g by mouth daily.   scopolamine 1 MG/3DAYS Commonly  known as: TRANSDERM-SCOP Place 1 patch (1.5 mg total) onto the skin every 3 (three) days. Notes to patient: For dizziness   senna 8.6 MG Tabs tablet Commonly known as: SENOKOT Take 2 tablets (17.2 mg total) by mouth at bedtime.       Follow-up Information    Raulkar, Drema Pry, MD Follow up.   Specialty: Physical Medicine and Rehabilitation Why: 11/14/20 please arrive at 9:00 for 9:20 appointment Contact information: [address] [place] [address] [phone]        [name], MD. Call.   Specialty: Internal Medicine Why: for post hospital follow up. Will need work up of thyroid and LLL nodule.  Contact information: [address] [place] [address] [phone]        [facility]. Call.   Why: for post stroke follow up Contact information: [address]     [address] [place] [address] [phone]       [facility] Office Follow up.   Specialty: Cardiology Why: Office will mail 30 day event monitor to your home.  Contact information: [address] [place] [address] [phone]              Signed: Jacquelynn Cree 10/29/2020, 5:35 PM

## 2020-10-26 NOTE — Progress Notes (Addendum)
[facility] PHYSICAL MEDICINE & REHABILITATION PROGRESS NOTE  Subjective/Complaints: C/o vertigo, ear fullness- will give some debrox ear drops before d/c in case of wax Bradycardic to 55 BP better controlled to 148/63- provided with list of good foods for HTN and he is reviewing  ROS: denies CP, SOB, N/V/D, +ear fullness  Objective: Vital Signs: Blood pressure (!) 148/63, pulse (!) 55, temperature 98.8 F (37.1 C), resp. rate 16, height 5\' 6"  (1.676 m), weight 61.9 kg, SpO2 100 %. No results found. No results for input(s): WBC, HGB, HCT, PLT in the last 72 hours. No results for input(s): NA, K, CL, CO2, GLUCOSE, BUN, CREATININE, CALCIUM in the last 72 hours.  Intake/Output Summary (Last 24 hours) at 10/26/2020 1021 Last data filed at 10/26/2020 0837 Gross per 24 hour  Intake 480 ml  Output 700 ml  Net -220 ml        Physical Exam: BP (!) 148/63 (BP Location: Left Arm)   Pulse (!) 55   Temp 98.8 F (37.1 C)   Resp 16   Ht 5\' 6"  (1.676 m)   Wt 61.9 kg   SpO2 100%   BMI 22.03 kg/m  Gen: no distress, normal appearing HEENT: oral mucosa pink and moist, NCAT Cardio: Reg rate Chest: normal effort, normal rate of breathing Abd: soft, non-distended Ext: no edema Psych: pleasant, normal affect Skin: intact Neuro: Alert Cerebellar normal finger-nose-finger as well as toe to finger bilaterally HOH Motor: 5/5 throughout, stable  Assessment/Plan: 1. Functional deficits which require 3+ hours per day of interdisciplinary therapy in a comprehensive inpatient rehab setting.  Physiatrist is providing close team supervision and 24 hour management of active medical problems listed below.  Physiatrist and rehab team continue to assess barriers to discharge/monitor patient progress toward functional and medical goals   Care Tool:  Bathing    Body parts bathed by patient: Right arm,Left arm,Chest,Abdomen,Front perineal area,Buttocks,Right upper leg,Left upper leg,Right lower  leg,Left lower leg,Face         Bathing assist Assist Level: Supervision/Verbal cueing     Upper Body Dressing/Undressing Upper body dressing   What is the patient wearing?: Pull over shirt    Upper body assist Assist Level: Independent    Lower Body Dressing/Undressing Lower body dressing      What is the patient wearing?: Pants,Underwear/pull up     Lower body assist Assist for lower body dressing: Supervision/Verbal cueing     Toileting Toileting    Toileting assist Assist for toileting: Supervision/Verbal cueing     Transfers Chair/bed transfer  Transfers assist     Chair/bed transfer assist level: Supervision/Verbal cueing     Locomotion Ambulation   Ambulation assist      Assist level: Supervision/Verbal cueing Assistive device: Cane-straight Max distance: 150   Walk 10 feet activity   Assist     Assist level: Supervision/Verbal cueing Assistive device: Cane-straight   Walk 50 feet activity   Assist    Assist level: Supervision/Verbal cueing Assistive device: Cane-straight    Walk 150 feet activity   Assist Walk 150 feet activity did not occur: Safety/medical concerns  Assist level: Supervision/Verbal cueing Assistive device: Cane-straight    Walk 10 feet on uneven surface  activity   Assist Walk 10 feet on uneven surfaces activity did not occur: Safety/medical concerns   Assist level: Contact Guard/Touching assist Assistive device: 10/28/2020     Assist Will patient use wheelchair at discharge?: No Type of Wheelchair:  assist level: Supervision/Verbal cueing Max wheelchair distance: 100    Wheelchair 50 feet with 2 turns activity    Assist        Assist Level: Supervision/Verbal cueing   Wheelchair 150 feet activity     Assist  Wheelchair 150 feet activity did not occur: Refused   Assist Level: Moderate Assistance - Patient 50 - 74%    Medical Problem  List and Plan: 1.  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  DC home today.  2.  Impaired mobility: -DVT/anticoagulation:  Pharmaceutical: d/c Lovenox since ambulating >150 feet CBC within normal limits on 4/11             -antiplatelet therapy: DAPT 3. Pain Management:  N/A 4. Mood: LCSW to follow up for evaluation and support.              -antipsychotic agents: N/A 5. Neuropsych: This patient is capable of making decisions on his own behalf. 6. Skin/Wound Care: Routine pressure relief measures  7. Fluids/Electrolytes/Nutrition: Monitor I/Os 8. HTN: Monitor BP tid             Norvasc 10mg  daily  Changed lisinopril to 5mg  with supper since BP higher in evenings. Provided with list of good foods for HTN.   Vitals:   10/25/20 2016 10/26/20 0452  BP: (!) 153/69 (!) 148/63  Pulse: (!) 53 (!) 55  Resp: 18 16  Temp: 97.9 F (36.6 C) 98.8 F (37.1 C)  SpO2: 100% 100%   9.  Dyslipidemia: Continue Lipitor 10. Vestibular symptoms: May need to pre-medicate prior to activity.             --Continue scopolamine patch              --Vestibular eval   Appears to be improving 11. Slow transit constipation:   Improving             Increased bowel meds as necessary 12.  Transaminitis  AST mildly elevated on 4/7, continue to monitor 13. Bradycardia: continue to monitor outpatient 14. Prediabetes HgbA1c 6.2 on 10/14/20: provided dietary education, repeat in July  15. Disposition: d/c Friday. Lives with wife. Hospital follow-up with Dr. 10-19-1977 scheduled on 11/14/20 9:20.   >30 minutes spent in discharge of patient including review of medications and follow-up appointments, physical examination, and in answering all patient's questions  LOS: 9 days A FACE TO FACE EVALUATION WAS PERFORMED  Charles Porter P Fong Mccarry 10/26/2020, 10:21 AM

## 2020-10-29 DIAGNOSIS — R911 Solitary pulmonary nodule: Secondary | ICD-10-CM

## 2020-10-29 DIAGNOSIS — E041 Nontoxic single thyroid nodule: Secondary | ICD-10-CM

## 2020-10-31 ENCOUNTER — Encounter: Payer: Self-pay | Admitting: *Deleted

## 2020-10-31 ENCOUNTER — Telehealth: Payer: Self-pay | Admitting: *Deleted

## 2020-10-31 NOTE — Progress Notes (Signed)
Patient ID: Charles Porter, male   DOB: [birth year], 85 y.o.   MRN: [id number] Patient enrolled for [employer] to ship a 30 day cardiac event monitor to his home. Letter with instructions mailed to patient.

## 2020-10-31 NOTE — Telephone Encounter (Signed)
Attempting to contact patient to arrange 30 day cardiac event monitor.

## 2020-11-03 ENCOUNTER — Ambulatory Visit (INDEPENDENT_AMBULATORY_CARE_PROVIDER_SITE_OTHER): Payer: BC Managed Care – PPO

## 2020-11-03 DIAGNOSIS — I639 Cerebral infarction, unspecified: Secondary | ICD-10-CM | POA: Diagnosis not present

## 2020-11-03 DIAGNOSIS — R42 Dizziness and giddiness: Secondary | ICD-10-CM | POA: Diagnosis not present

## 2020-11-09 ENCOUNTER — Encounter: Payer: Self-pay | Admitting: Internal Medicine

## 2020-11-14 ENCOUNTER — Encounter
Payer: BC Managed Care – PPO | Attending: Physical Medicine and Rehabilitation | Admitting: Physical Medicine and Rehabilitation

## 2020-11-14 ENCOUNTER — Encounter: Payer: Self-pay | Admitting: Physical Medicine and Rehabilitation

## 2020-11-14 ENCOUNTER — Other Ambulatory Visit: Payer: Self-pay

## 2020-11-14 VITALS — BP 166/49 | HR 62 | Temp 98.5°F | Ht 66.0 in | Wt 142.0 lb

## 2020-11-14 DIAGNOSIS — I160 Hypertensive urgency: Secondary | ICD-10-CM | POA: Diagnosis not present

## 2020-11-14 DIAGNOSIS — I639 Cerebral infarction, unspecified: Secondary | ICD-10-CM | POA: Diagnosis not present

## 2020-11-14 DIAGNOSIS — R42 Dizziness and giddiness: Secondary | ICD-10-CM | POA: Diagnosis present

## 2020-11-14 MED ORDER — SCOPOLAMINE 1 MG/3DAYS TD PT72: 1.0000 | MEDICATED_PATCH | TRANSDERMAL | 0 refills | Status: AC

## 2020-11-14 MED ORDER — LISINOPRIL 5 MG PO TABS: 5.0000 mg | ORAL_TABLET | Freq: Every day | ORAL | 0 refills | Status: AC

## 2020-11-14 MED ORDER — ASPIRIN 325 MG PO TABS: 325.0000 mg | ORAL_TABLET | Freq: Every day | ORAL | 3 refills | Status: AC

## 2020-11-14 MED ORDER — AMLODIPINE BESYLATE 10 MG PO TABS: 10.0000 mg | ORAL_TABLET | Freq: Every day | ORAL | 0 refills | Status: AC

## 2020-11-14 MED ORDER — LORATADINE 10 MG PO TABS: 10.0000 mg | ORAL_TABLET | Freq: Every day | ORAL | 2 refills | Status: AC

## 2020-11-14 MED ORDER — ATORVASTATIN CALCIUM 80 MG PO TABS: 80.0000 mg | ORAL_TABLET | Freq: Every day | ORAL | 0 refills | Status: AC

## 2020-11-14 MED ORDER — POLYETHYLENE GLYCOL 3350 17 G PO PACK: 17.0000 g | PACK | Freq: Every day | ORAL | 0 refills | Status: AC

## 2020-11-14 MED ORDER — CLOPIDOGREL BISULFATE 75 MG PO TABS: 75.0000 mg | ORAL_TABLET | Freq: Every day | ORAL | 2 refills | Status: DC

## 2020-11-14 NOTE — Patient Instructions (Signed)
HTN: 1) citrus foods- high in vitamins and minerals 2) salmon and other fatty fish - reduces inflammation and oxylipins 3) swiss chard (leafy green)- high level of nitrates 4) pumpkin seeds- one of the best natural sources of magnesium 5) Beans and lentils- high in fiber, magnesium, and potassium 6) Berries- high in flavonoids 7) Amaranth (whole grain, can be cooked similarly to rice and oats)- high in magnesium and fiber 8) Pistachios- even more effective at reducing BP than other nuts 9) Carrots- high in phenolic compounds that relax blood vessels and reduce inflammation 10) Celery- contain phthalides that relax tissues of arterial walls 11) Tomatoes- can also improve cholesterol and reduce risk of heart disease 12) Broccoli- good source of magnesium, calcium, and potassium 13) Greek yogurt: high in potassium and calcium 14) Herbs and spices: Celery seed, cilantro, saffron, lemongrass, black cumin, ginseng, cinnamon, cardamom, sweet basil, and ginger 15) Chia and flax seeds- also help to lower cholesterol and blood sugar 16) Beets- high levels of nitrates that relax blood vessels  17) spinach and bananas- high in potassium

## 2020-11-14 NOTE — Progress Notes (Signed)
Subjective:    Patient ID: Charles Porter, male    DOB: August 13, 1935, 85 y.o.   MRN: [id number]  HPI  Charles Porter is an 85 year old man who presents for hospital follow-up after CIR admission for cerebellar stroke.  1) cerebellar stroke -he has been walking a little bit at home -he still notes left sided weakness -currently he is getting home therapy.   2) vertigo -this is his chief complaint -discussed vestibular therapy and he is interested -she wears the scopolamine patch- not sure if it is helping.  3) elevated systolic BP and low diastolic BP. -166/49 today  4) hard of hearing -hard for him to hear on the side of the stroke   Pain Inventory Average Pain 0 Pain Right Now 0 My pain is no pain  LOCATION OF PAIN  No pain   BOWEL Number of stools per week: 5-6 Oral laxative use Yes  Type of laxative colace, miralax, senna Enema or suppository use No  History of colostomy No  Incontinent No   BLADDER Normal In and out cath, frequency n/a Able to self cath n/a Bladder incontinence No  Frequent urination No  Leakage with coughing No  Difficulty starting stream No  Incomplete bladder emptying No    Mobility walk with assistance use a walker  Function employed # of hrs/week 0 disabled: date disabled applied  Neuro/Psych trouble walking dizziness  Prior Studies hospital f/u  Physicians involved in your care hospital f/u   Family History  Problem Relation Age of Onset  . Hypertension Neg Hx    Social History   Socioeconomic History  . Marital status: Married    Spouse name: Not on file  . Number of children: Not on file  . Years of education: Not on file  . Highest education level: Not on file  Occupational History  . Not on file  Tobacco Use  . Smoking status: Former Smoker    Types: Cigarettes  . Smokeless tobacco: Never Used  Vaping Use  . Vaping Use: Never used  Substance and Sexual Activity  . Alcohol use: No  . Drug use: No  .  Sexual activity: Not Currently  Other Topics Concern  . Not on file  Social History Narrative  . Not on file   Social Determinants of Health   Financial Resource Strain: Not on file  Food Insecurity: Not on file  Transportation Needs: Not on file  Physical Activity: Not on file  Stress: Not on file  Social Connections: Not on file   History reviewed. No pertinent surgical history. History reviewed. No pertinent past medical history. Temp 98.5 F (36.9 C)   Ht 5\' 6"  (1.676 m)   Wt 142 lb (64.4 kg)   BMI 22.92 kg/m   Opioid Risk Score:   Fall Risk Score:  `1  Depression screen PHQ 2/9  No flowsheet data found.  Review of Systems  Constitutional: Negative.   HENT: Negative.   Eyes: Negative.   Respiratory: Negative.   Cardiovascular: Negative.   Gastrointestinal: Negative.   Endocrine: Negative.   Genitourinary: Negative.   Musculoskeletal: Positive for gait problem.  Skin: Negative.   Allergic/Immunologic: Negative.   Neurological: Positive for dizziness and light-headedness.       Vertigo   Hematological: Negative.   Psychiatric/Behavioral: Negative.   All other systems reviewed and are negative.      Objective:   Physical Exam Gen: no distress, normal appearing HEENT: oral mucosa pink and moist, NCAT  Cardio: Reg rate Chest: normal effort, normal rate of breathing Abd: soft, non-distended Ext: no edema Psych: pleasant, normal affect Skin: intact Neuro: Alert and oriented x3. Hard of hearing.  Musculoskeletal: 5/5 strength throughout. Able to ambulate slowly with and without walker    Assessment & Plan:  1) Cerebellar stroke -continue home PT and OT  2) HTN: -BP is 166/49 today.  -Advised checking BP daily at home and logging results to bring into follow-up appointment with her PCP and myself. -Reviewed BP meds today.  -Advised regarding healthy foods that can help lower blood pressure and provided with a list: 1) citrus foods- high in vitamins  and minerals 2) salmon and other fatty fish - reduces inflammation and oxylipins 3) swiss chard (leafy green)- high level of nitrates 4) pumpkin seeds- one of the best natural sources of magnesium 5) Beans and lentils- high in fiber, magnesium, and potassium 6) Berries- high in flavonoids 7) Amaranth (whole grain, can be cooked similarly to rice and oats)- high in magnesium and fiber 8) Pistachios- even more effective at reducing BP than other nuts 9) Carrots- high in phenolic compounds that relax blood vessels and reduce inflammation 10) Celery- contain phthalides that relax tissues of arterial walls 11) Tomatoes- can also improve cholesterol and reduce risk of heart disease 12) Broccoli- good source of magnesium, calcium, and potassium 13) Greek yogurt: high in potassium and calcium 14) Herbs and spices: Celery seed, cilantro, saffron, lemongrass, black cumin, ginseng, cinnamon, cardamom, sweet basil, and ginger 15) Chia and flax seeds- also help to lower cholesterol and blood sugar 16) Beets- high levels of nitrates that relax blood vessels  17) spinach and bananas- high in potassium -Educated that goal BP is 120/80. -Made goal to incorporate some of the above foods into diet.   3) Vertigo: -referred to vestibular therapy. -refilled scopolamine patch

## 2020-12-12 ENCOUNTER — Other Ambulatory Visit: Payer: Self-pay | Admitting: Neurology

## 2020-12-12 DIAGNOSIS — I639 Cerebral infarction, unspecified: Secondary | ICD-10-CM

## 2020-12-16 ENCOUNTER — Other Ambulatory Visit: Payer: Self-pay | Admitting: Physical Medicine and Rehabilitation

## 2021-02-15 ENCOUNTER — Encounter
Payer: BC Managed Care – PPO | Attending: Physical Medicine and Rehabilitation | Admitting: Physical Medicine and Rehabilitation

## 2021-02-15 DIAGNOSIS — I639 Cerebral infarction, unspecified: Secondary | ICD-10-CM | POA: Insufficient documentation

## 2021-02-15 DIAGNOSIS — I160 Hypertensive urgency: Secondary | ICD-10-CM | POA: Insufficient documentation

## 2021-02-15 DIAGNOSIS — R42 Dizziness and giddiness: Secondary | ICD-10-CM | POA: Insufficient documentation

## 2021-03-13 ENCOUNTER — Encounter: Payer: Self-pay | Admitting: Adult Health

## 2021-03-13 ENCOUNTER — Ambulatory Visit (INDEPENDENT_AMBULATORY_CARE_PROVIDER_SITE_OTHER): Payer: BC Managed Care – PPO | Admitting: Adult Health

## 2021-03-13 VITALS — BP 156/71 | HR 60 | Ht 66.0 in | Wt 151.0 lb

## 2021-03-13 DIAGNOSIS — I6503 Occlusion and stenosis of bilateral vertebral arteries: Secondary | ICD-10-CM

## 2021-03-13 DIAGNOSIS — I63213 Cerebral infarction due to unspecified occlusion or stenosis of bilateral vertebral arteries: Secondary | ICD-10-CM | POA: Diagnosis not present

## 2021-03-13 DIAGNOSIS — I639 Cerebral infarction, unspecified: Secondary | ICD-10-CM

## 2021-03-13 DIAGNOSIS — R7303 Prediabetes: Secondary | ICD-10-CM | POA: Diagnosis not present

## 2021-03-13 DIAGNOSIS — I6523 Occlusion and stenosis of bilateral carotid arteries: Secondary | ICD-10-CM

## 2021-03-13 DIAGNOSIS — E785 Hyperlipidemia, unspecified: Secondary | ICD-10-CM | POA: Diagnosis not present

## 2021-03-13 DIAGNOSIS — R911 Solitary pulmonary nodule: Secondary | ICD-10-CM

## 2021-03-13 NOTE — Patient Instructions (Signed)
You will be called to complete a carotid ultrasound to evaluate the narrowing in your carotid and vertebral arteries  You will be called to schedule a CT scan of your chest to follow-up on a small area in your lung on prior imaging  Continue aspirin 325 mg daily  and atorvastatin 80 mg daily for secondary stroke prevention  Please stop Plavix (clopidogrel) at this time  We will check cholesterol levels and A1c levels today   Continue to follow up with PCP regarding cholesterol and blood pressure management  Maintain strict control of hypertension with blood pressure goal below 130/90 and cholesterol with LDL cholesterol (bad cholesterol) goal below 70 mg/dL.       Followup in the future with me in 6 months or call earlier if needed       Thank you for coming to see [place] at [facility]. I hope we have been able to provide you high quality care today.  You may receive a patient satisfaction survey over the next few weeks. We would appreciate your feedback and comments so that we may continue to improve ourselves and the health of our patients.

## 2021-03-13 NOTE — Progress Notes (Signed)
[facility] [address] [place]. [place] [address] [phone]       HOSPITAL FOLLOW UP NOTE  [name] Date of Birth:  08-06-1935 Medical Record Number:  [id number]   Reason for Referral:  hospital stroke follow up    SUBJECTIVE:   CHIEF COMPLAINT:  Chief Complaint  Patient presents with   Follow-up    Rm 3 alone Pt is well and stable, just having some hearing complications.      HPI:   Charles Porter is a 85 y.o. male with no PMH who presented on 10/13/2020 with dizziness and imbalance.  Personally reviewed hospitalization pertinent progress notes, lab work and imaging.  Found to have extremely high BP, received hydralazine then developed left facail droop and slurry speech with vomiting. MRI found to have bilateral cerebellar infarcts likely secondary to large vessel atherosclerosis from bilateral VA stenosis/occlusion however cardioembolic source cannot be completely ruled out.  EF 60 to 65%.  Recommended 30-day cardiac event monitor outpatient to rule out A. fib.  LDL 165.  A1c 6.2.  Recommended DAPT for 3 months then aspirin alone.  Unstable BP with near syncope with cerebral hypoperfusion after hydralazine dose -added amlodipine 10 mg daily with long-term BP goal 130-150 given bilateral VA stenosis/occlusion.  Added atorvastatin 80 mg daily.  Incidental finding on left lower lobe lung nodule and left thyroid nodule -deferred to PCP for follow-up.  oF NOTE, multiple remote lacunar infarcts involving the hemispheric cerebral white matter and cerebellum noted on imaging.  Residual deficits right gaze persistent nystagmus, dizziness and imbalance and LUE weakness.  Discharged to CIR 4/6 - 4/15 per therapy recommendations  Today, 03/13/2021, Charles Porter is being seen for hospital follow-up unaccompanied.  Overall stable.  Residual mild LUE weakness, left sided hearing impairment and difficulty focusing but overall greatly improving.  Denies residual  vertigo or balance difficulties.  Return back to all prior activities without difficulty.  Maintains ADLs and IADLs independently.  Denies new stroke/TIA symptoms.  Remains on both aspirin and Plavix as well as atorvastatin.  Blood pressure today 156/71 on amlodipine and lisinopril not routinely monitored at home.  Has had followed with PCP since discharge, denies having lab work completed (unable to view via epic).  No further concerns at this time.    PERTINENT IMAGING  CTA HEAD/NECK 10/13/2020 CT CEREBRAL PERFUSION IMPRESSION: 1. Negative CTA for emergent large vessel occlusion. 2. Negative CT perfusion with no evidence for acute ischemia or other perfusion deficit. 3. Atheromatous change about the carotid bifurcations bilaterally with associated stenoses of up to 60-70% bilaterally. 4. Moderate to severe atherosclerotic change throughout the carotid siphons with associated moderate to severe multifocal stenoses. 5. Occlusion of the right vertebral artery at its origin, with multifocal severe proximal left V1 and distal left V4 stenoses. 6. Focal moderate to severe proximal left M2 stenosis, inferior division. 7. 4 mm left lower lobe nodule, indeterminate. Consider follow-up examination with dedicated chest CT for further evaluation.  MR BRAIN 10/14/2018 IMPRESSION: 1. Patchy small volume acute ischemic infarcts involving the left greater than right cerebellar hemispheres. No associated hemorrhage or mass effect. 2. Age-related cerebral atrophy with moderate chronic microvascular ischemic disease, with multiple remote lacunar infarcts involving the hemispheric cerebral white matter and cerebellum.      ROS:   14 system review of systems performed and negative with exception of those listed in HPI  PMH: History reviewed. No pertinent past medical history.  PSH: History reviewed. No pertinent  surgical history.  Social History:  Social History   Socioeconomic History    Marital status: Married    Spouse name: Not on file   Number of children: Not on file   Years of education: Not on file   Highest education level: Not on file  Occupational History   Not on file  Tobacco Use   Smoking status: Former    Types: Cigarettes   Smokeless tobacco: Never  Vaping Use   Vaping Use: Never used  Substance and Sexual Activity   Alcohol use: No   Drug use: No   Sexual activity: Not Currently  Other Topics Concern   Not on file  Social History Narrative   Not on file   Social Determinants of Health   Financial Resource Strain: Not on file  Food Insecurity: Not on file  Transportation Needs: Not on file  Physical Activity: Not on file  Stress: Not on file  Social Connections: Not on file  Intimate Partner Violence: Not on file    Family History:  Family History  Problem Relation Age of Onset   Hypertension Neg Hx     Medications:   Current Outpatient Medications on File Prior to Visit  Medication Sig Dispense Refill   amLODipine (NORVASC) 10 MG tablet Take 1 tablet (10 mg total) by mouth daily. 30 tablet 0   aspirin 325 MG tablet Take 1 tablet (325 mg total) by mouth daily. 30 tablet 3   atorvastatin (LIPITOR) 80 MG tablet Take 1 tablet (80 mg total) by mouth daily. 30 tablet 0   docusate sodium (COLACE) 100 MG capsule Take 1 capsule (100 mg total) by mouth 2 (two) times daily. 10 capsule 0   lisinopril (ZESTRIL) 5 MG tablet Take 1 tablet (5 mg total) by mouth daily with supper. 30 tablet 0   loratadine (CLARITIN) 10 MG tablet Take 1 tablet (10 mg total) by mouth daily. 30 tablet 2   polyethylene glycol (MIRALAX / GLYCOLAX) 17 g packet Take 17 g by mouth daily. 30 each 0   scopolamine (TRANSDERM-SCOP) 1 MG/3DAYS Place 1 patch (1.5 mg total) onto the skin every 3 (three) days. 10 patch 0   senna (SENOKOT) 8.6 MG TABS tablet Take 2 tablets (17.2 mg total) by mouth at bedtime. 120 tablet 0   No current facility-administered medications on file prior  to visit.    Allergies:  No Known Allergies    OBJECTIVE:  Physical Exam  Vitals:   03/13/21 0916  BP: (!) 156/71  Pulse: 60  Weight: 151 lb (68.5 kg)  Height: 5\' 6"  (1.676 m)   Body mass index is 24.37 kg/m. No results found.  Post stroke PHQ 2/9 Depression screen PHQ 2/9 03/13/2021  Decreased Interest 0  Down, Depressed, Hopeless 0  PHQ - 2 Score 0     General: well developed, well nourished, very pleasant elderly African-American male, seated, in no evident distress Head: head normocephalic and atraumatic.   Neck: supple with no carotid or supraclavicular bruits Cardiovascular: regular rate and rhythm, no murmurs Musculoskeletal: no deformity Skin:  no rash/petichiae Vascular:  Normal pulses all extremities   Neurologic Exam Mental Status: Awake and fully alert.  Fluent speech and language.  Oriented to place and time. Recent and remote memory intact. Attention span, concentration and fund of knowledge appropriate. Mood and affect appropriate.  Cranial Nerves: Fundoscopic exam reveals sharp disc margins. Pupils equal, briskly reactive to light. Extraocular movements full without nystagmus. Visual fields full to confrontation.  HOH bilaterally (subjectively, L>R). Rinne and Weber testing normal.  Facial sensation intact. Face, tongue, palate moves normally and symmetrically.  Motor: Normal bulk and tone. Normal strength in all tested extremity muscles except slight decrease left hand dexterity.  Orbits right arm over left arm. Sensory.: intact to touch , pinprick , position and vibratory sensation.  Coordination: Rapid alternating movements normal in all extremities except decreased left hand dexterity. Finger-to-nose and heel-to-shin performed accurately bilaterally. Gait and Station: Arises from chair without difficulty. Stance is normal. Gait demonstrates normal stride length and balance without use of assistive device. Reflexes: 1+ and symmetric. Toes downgoing.      NIHSS  0 Modified Rankin  1      ASSESSMENT: Charles Porter is a 85 y.o. 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 Vascular risk factors include HTN, HLD, pre-DM, multifocal bilateral cerebrovascular stenosis and advanced age.      PLAN:  B/L cerebellar strokes:  Residual deficit: mild left hand weakness and subjective left sided decreased hearing. Gradually continues to improve. Continue aspirin 325 mg daily  and atorvastatin 80 mg daily for secondary stroke prevention.  Advised to discontinue Plavix as 3 months DAPT completed. Discussed secondary stroke prevention measures and importance of close PCP follow up for aggressive stroke risk factor management. I have gone over the pathophysiology of stroke, warning signs and symptoms, risk factors and their management in some detail with instructions to go to the closest emergency room for symptoms of concern. B/L VA stenosis, b/l carotid stenosis:  obtain carotid ultrasound for surveillance monitoring.   Continue aspirin and statin as well as maintain BP goal 130-150 to ensure adequate perfusion HTN: BP goal 130-150.  Stable on current regimen per PCP HLD: LDL goal <70. Recent LDL 162.  Repeat lipid panel. Continue atorvastatin 80 mg daily Pre-DMII: A1c goal<7.0. Recent A1c 6.2. repeat A1c Lung nodule:  CTA 10/13/2020: 4 mm left lower lobe nodule, indeterminate. Consider follow-up examination with dedicated chest CT for further evaluation.  Will obtain CT chest for f/u as not yet completed    Follow up in 6 months or call earlier if needed   CC:  [facility] provider: Dr. Pearlean Brownie PCP: [name], MD    I spent 52 minutes of face-to-face and non-face-to-face time with patient.  This included previsit chart review including review of recent hospitalization, lab review, study  review, order entry, electronic health record documentation, patient education regarding recent stroke including etiology, secondary stroke prevention measures and importance of managing stroke risk factors, surveillance monitoring as noted above, residual deficits and typical recovery time and answered all other questions to patient satisfaction   Ihor Austin, AGNP-BC  [facility] [address] [place], [place] [address]  Phone [phone] Fax [phone] Note: This document was prepared with digital dictation and possible smart phrase technology. Any transcriptional errors that result from this process are unintentional.

## 2021-03-14 ENCOUNTER — Other Ambulatory Visit: Payer: Self-pay | Admitting: Adult Health

## 2021-03-14 ENCOUNTER — Telehealth: Payer: Self-pay | Admitting: *Deleted

## 2021-03-14 ENCOUNTER — Telehealth: Payer: Self-pay | Admitting: Adult Health

## 2021-03-14 LAB — LIPID PANEL
Chol/HDL Ratio: 4 ratio (ref 0.0–5.0)
Cholesterol, Total: 222 mg/dL — ABNORMAL HIGH (ref 100–199)
HDL: 55 mg/dL (ref >39)
LDL Chol Calc (NIH): 147 mg/dL — ABNORMAL HIGH (ref 0–99)
Triglycerides: 113 mg/dL (ref 0–149)
VLDL Cholesterol Cal: 20 mg/dL (ref 5–40)

## 2021-03-14 LAB — HEMOGLOBIN A1C
Est. average glucose Bld gHb Est-mCnc: 128 mg/dL
Hgb A1c MFr Bld: 6.1 % — ABNORMAL HIGH (ref 4.8–5.6)

## 2021-03-14 MED ORDER — EZETIMIBE 10 MG PO TABS: 10.0000 mg | ORAL_TABLET | Freq: Every day | ORAL | 5 refills | Status: DC

## 2021-03-14 NOTE — Telephone Encounter (Signed)
Medicare no auth BCBS no auth spoke to [place] Ref # [id number]. Sent Lupita Leash a message she will reach out to the patient to schedule.

## 2021-03-14 NOTE — Telephone Encounter (Signed)
Medicare/BCBS patient has [facility] faxed the order they will reach out to the patient to schedule. Ph # [phone] & fax # [phone].

## 2021-03-14 NOTE — Telephone Encounter (Signed)
Called patient, he was not at home. Spoke with wife on DPR, advised her that recent lab work showed continued elevated cholesterol levels with LDL or bad cholesterol at 623 with goal less than 70.  Shanda Bumps NP recommends adding Zetia 10 mg daily and continuation of atorvastatin 80 mg daily.  she has sent in new Rx. She also recommends it be repeated in 3 months with PCP. She wrote down instructions, verbalized understanding, appreciation.

## 2021-03-15 NOTE — Progress Notes (Signed)
I agree with the above plan 

## 2021-03-29 ENCOUNTER — Ambulatory Visit (HOSPITAL_COMMUNITY)
Admission: RE | Admit: 2021-03-29 | Discharge: 2021-03-29 | Disposition: A | Discharge status: Home / Self Care | Payer: BC Managed Care – PPO | Source: Ambulatory Visit | Attending: Adult Health | Admitting: Adult Health

## 2021-03-29 ENCOUNTER — Other Ambulatory Visit: Payer: Self-pay

## 2021-03-29 DIAGNOSIS — I6523 Occlusion and stenosis of bilateral carotid arteries: Secondary | ICD-10-CM

## 2021-03-29 DIAGNOSIS — I6503 Occlusion and stenosis of bilateral vertebral arteries: Secondary | ICD-10-CM | POA: Insufficient documentation

## 2021-03-29 NOTE — Progress Notes (Signed)
Carotid artery duplex completed. Refer to "CV Proc" under chart review to view preliminary results.  03/29/2021 10:03 AM Eula Fried., MHA, RVT, RDCS, RDMS

## 2021-04-01 ENCOUNTER — Other Ambulatory Visit: Payer: Self-pay | Admitting: Adult Health

## 2021-04-01 ENCOUNTER — Telehealth: Payer: Self-pay

## 2021-04-01 DIAGNOSIS — I6501 Occlusion and stenosis of right vertebral artery: Secondary | ICD-10-CM

## 2021-04-01 DIAGNOSIS — I6523 Occlusion and stenosis of bilateral carotid arteries: Secondary | ICD-10-CM

## 2021-04-01 NOTE — Telephone Encounter (Signed)
Contacted pt, informed him that his recent carotid ultrasound showed continued 60 to 79% narrowing in both his carotid arteries as well as occluded right vertebral artery consistent with prior imaging in 10/2020.  Referral will be placed to vascular surgery for ongoing monitoring and management.  Advised to call the office with questions as he had none at the time, stated he has no discomfort or anything and was appreciative of the call.

## 2021-04-01 NOTE — Telephone Encounter (Signed)
-----   Message from Ihor Austin, NP sent at 04/01/2021  8:01 AM EDT ----- Please advise patient that his recent carotid ultrasound showed continued 60 to 79% narrowing in both his carotid arteries as well as occluded right vertebral artery consistent with prior imaging in 10/2020.  Referral will be placed to vascular surgery for ongoing monitoring and management.

## 2021-04-02 ENCOUNTER — Telehealth: Payer: Self-pay | Admitting: Adult Health

## 2021-04-02 NOTE — Telephone Encounter (Signed)
Angel from [facility] called stating they are needing an order for the CT Scan. Fax number is [phone].

## 2021-04-02 NOTE — Telephone Encounter (Signed)
Contacted angel back,  spoke to Charles Porter, pt is need CT scan for  chest  Order faxed to [phone] confirmation received

## 2021-04-03 NOTE — Telephone Encounter (Signed)
pt is scheduled at [facility] for 04/04/21

## 2021-04-10 ENCOUNTER — Telehealth: Payer: Self-pay | Admitting: Adult Health

## 2021-04-10 NOTE — Telephone Encounter (Signed)
Called patient who wasn't at home. Spoke with wife, on [place] and informed her his CT chest scan showed no concerning findings on imaging or concern for lung nodules which was previously reported on CTA on 10/13/2020. Patient then came  home and I informed him of results. Patient verbalized understanding, appreciation.

## 2021-04-10 NOTE — Telephone Encounter (Signed)
Received results for CT chest from [facility] completed on 04/04/2021   FINDINGS:   #  LUNGS:  #  No effusions, pneumothoraces or concerning lung nodules.  #  Patchy groundglass opacity present in the lower lobes may represent interstitial pneumonitis, atelectasis or dependent edema.   #  MEDIASTINUM:  #  Normal heart size without pericardial effusion.  #  Mild aortic atherosclerosis, no visible acute vascular abnormalities.  #  No thoracic adenopathy. Patent central airways.   #  ABDOMEN:  #  Small liver cysts. No acute abnormalities.   #  MSK:  #  No acute or aggressive bone lesions.     IMPRESSION:  1. No concerning lung nodules.  2. Lower lobe dependent atelectasis, edema or chronic interstitial pneumonitis.    Please advise patient of results and there was no concerning findings on imaging or concern for lung nodules which was previously reported on CTA on 10/13/2020.  Thank you.

## 2021-05-07 ENCOUNTER — Other Ambulatory Visit: Payer: Self-pay | Admitting: Adult Health

## 2021-09-17 ENCOUNTER — Ambulatory Visit: Payer: BC Managed Care – PPO | Admitting: Adult Health

## 2022-11-27 IMAGING — CT CT HEAD W/O CM
2 series · 15 of 30 positions shown, 17 images · non-contrast
Comparison: None.

CLINICAL DATA: 82-year-old male with neurologic deficit.

EXAM:
CT HEAD WITHOUT CONTRAST
TECHNIQUE: Contiguous axial images were obtained from the base of the skull
through the vertex without intravenous contrast.

[Series 2: head wo · axial · 0.44mm/px · z∈[-266,-146]mm · 7 of 33 slices shown, 9 images]
[im 5/33  brain]
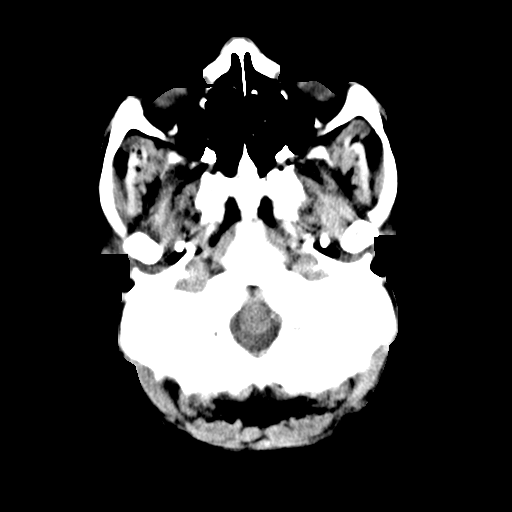
[im 5/33  bone]
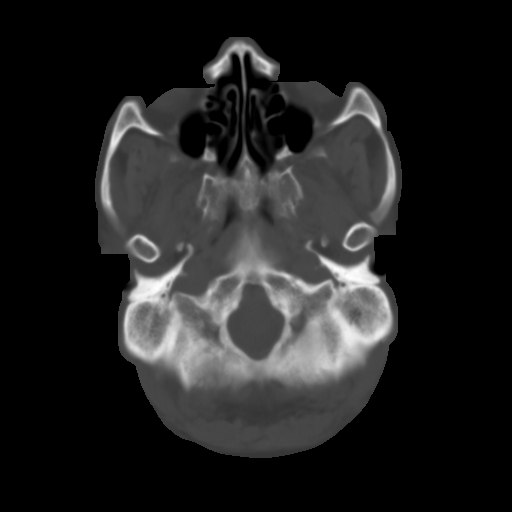
[im 9/33  brain]
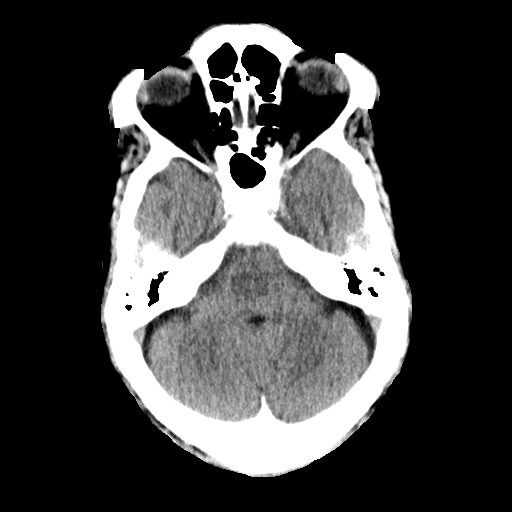
[im 13/33  brain]
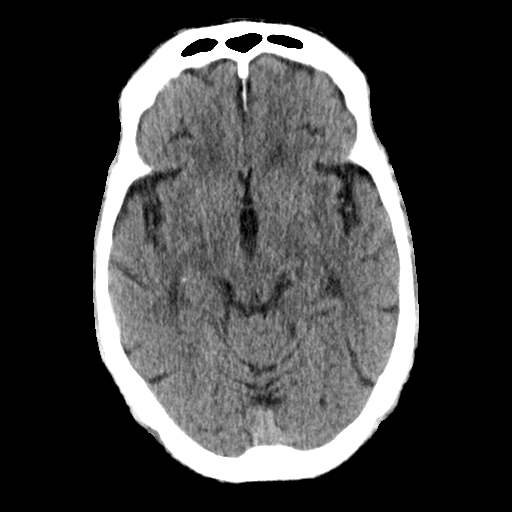
[im 17/33  brain]
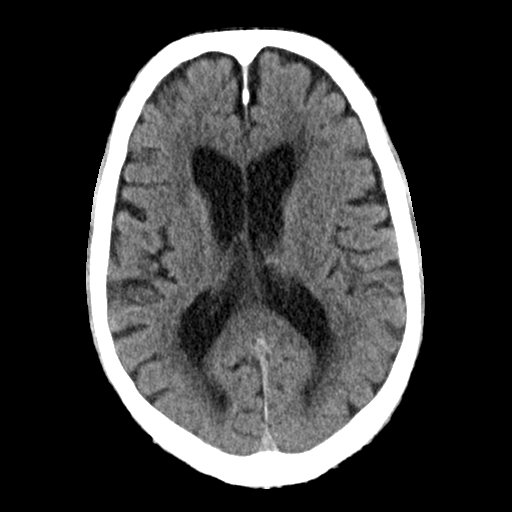
[im 21/33  brain]
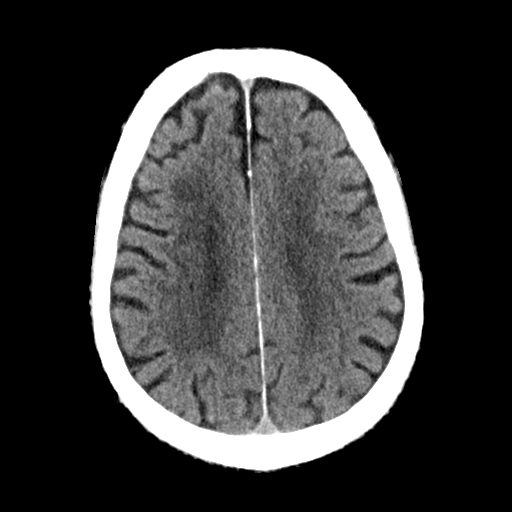
[im 21/33  bone]
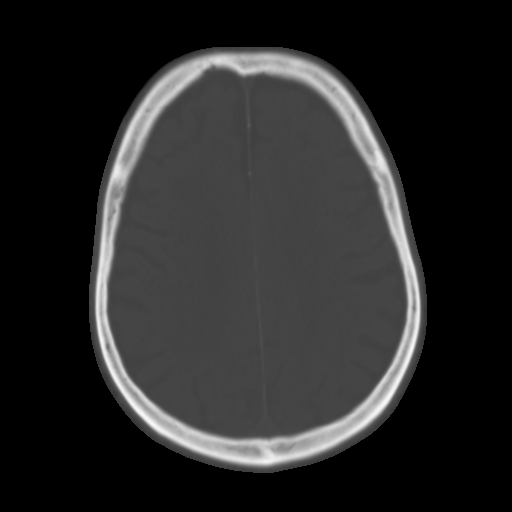
[im 25/33  brain]
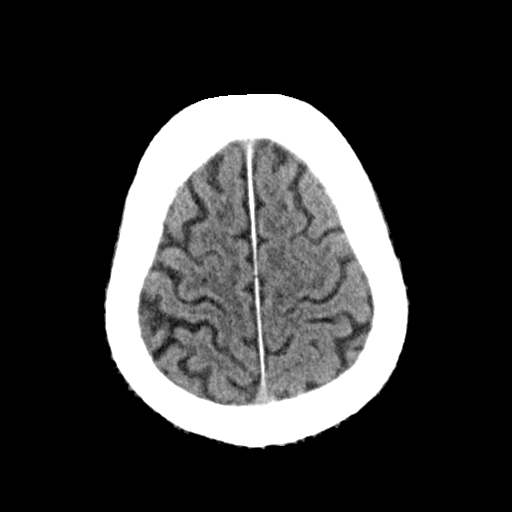
[im 29/33  brain]
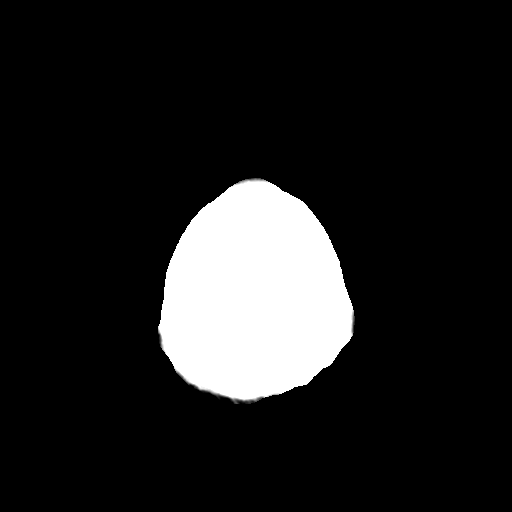

[Series 3: head bone · axial · 0.44mm/px · z∈[-270,-142]mm · 8 of 82 slices shown]
[im 9/82  bone]
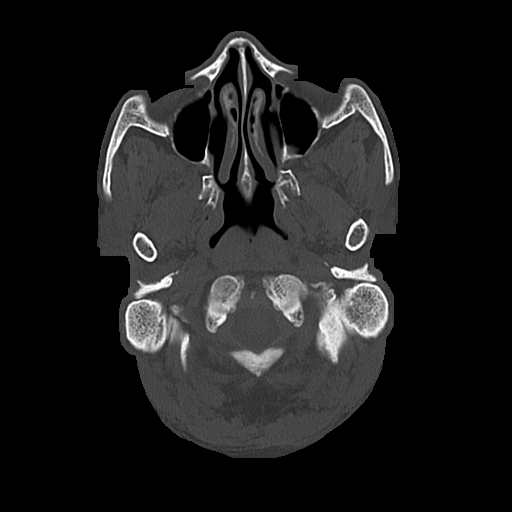
[im 17/82  bone]
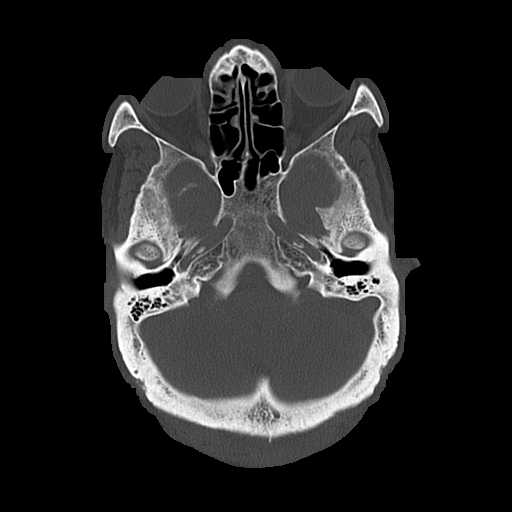
[im 25/82  bone]
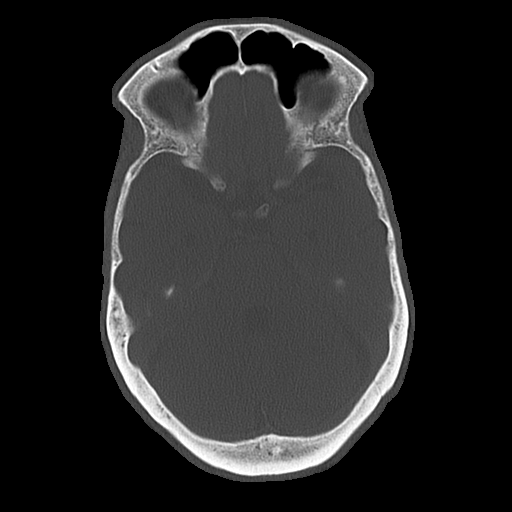
[im 37/82  bone]
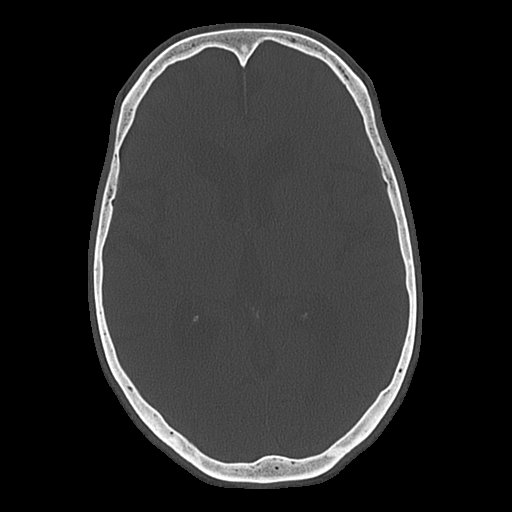
[im 45/82  bone]
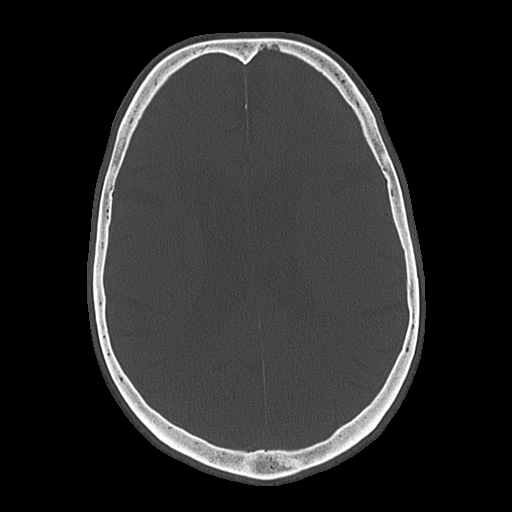
[im 57/82  bone]
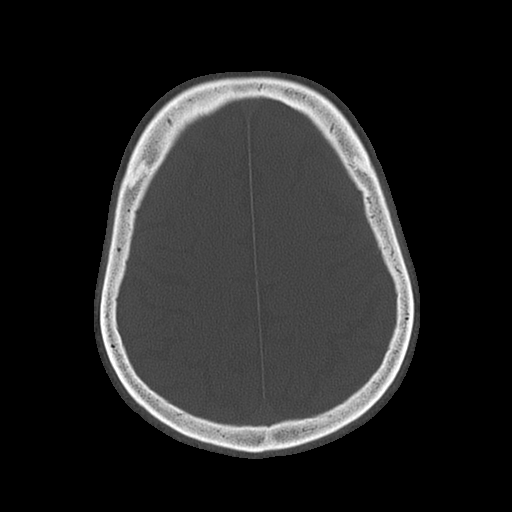
[im 65/82  bone]
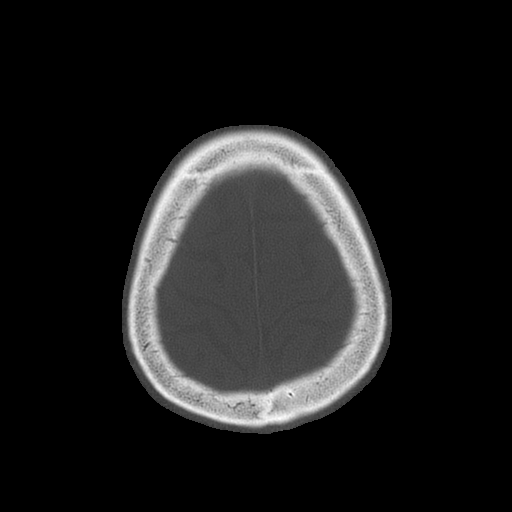
[im 73/82  bone]
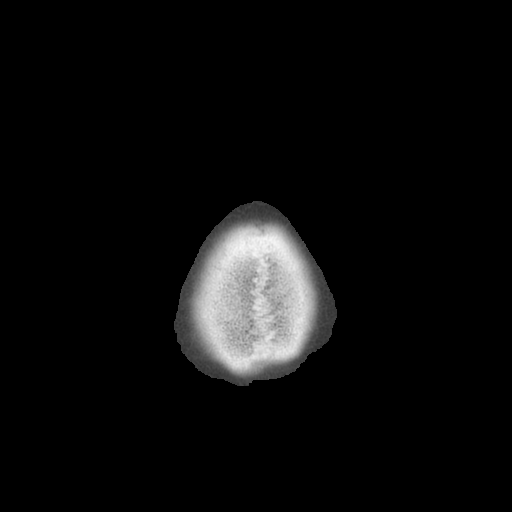

[15 of 30 positions shown; findings below may reference images not displayed]

FINDINGS: Brain: Mild age-related atrophy and moderate chronic microvascular
ischemic changes. There is no acute intracranial hemorrhage. No mass
effect midline shift. No extra-axial fluid collection.

Vascular: Apparent high attenuating focus in the M2 segment of the
right MCA ([DATE]). This is likely artifactual and related to
hemoconcentration/dehydration. Similar slightly higher attenuation
of the intracranial vasculature noted. If there is clinical concern
for right MCA territory infarct, further evaluation with CT is
recommended.

Skull: Normal. Negative for fracture or focal lesion.

Sinuses/Orbits: Mild mucoperiosteal thickening of paranasal sinuses
and a small left maxillary sinus retention cyst or polyp. No
air-fluid level. The mastoid air cells are clear.

Other: None
IMPRESSION: 1. No acute intracranial hemorrhage.
2. Age-related atrophy and chronic microvascular ischemic changes.

## 2022-11-27 IMAGING — MR MR HEAD W/O CM
6 of 11 series · 24 of 48 positions shown · IV contrast (Yes GAD)
Comparison: Prior CTs from 10/13/2020

CLINICAL DATA: Initial evaluation for 4 day history of intermittent
dizziness, stroke.

EXAM:
MRI HEAD WITHOUT CONTRAST
TECHNIQUE: Multiplanar, multiecho pulse sequences of the brain and surrounding
structures were obtained without intravenous contrast.

[Series 2: DWI · axial · 3.0mm · 0.94mm/px · z∈[-51,+96]mm · 7 of 100 slices shown (1 of 2)]
[im 1/100]
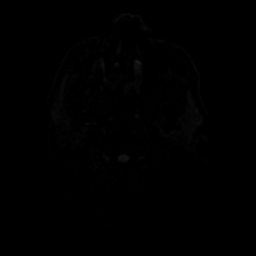
[im 17/100]
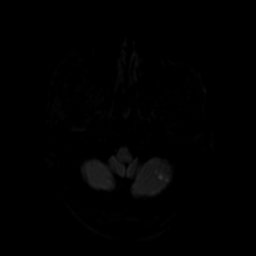
[im 34/100]
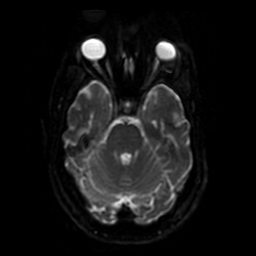
[im 50/100]
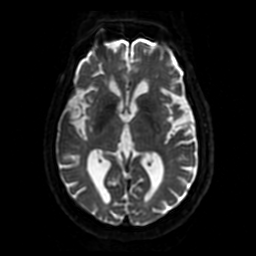
[im 67/100]
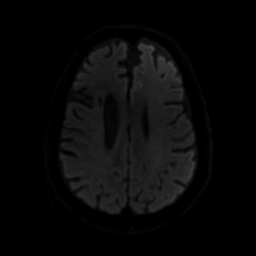
[im 83/100]
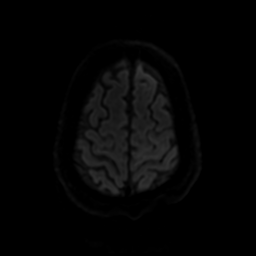
[im 100/100]
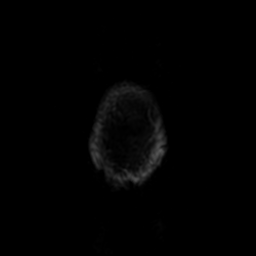

[Series 3: DWI · coronal · 4.0mm · 0.94mm/px · 6 of 76 slices shown (2 of 2)]
[im 1/76]
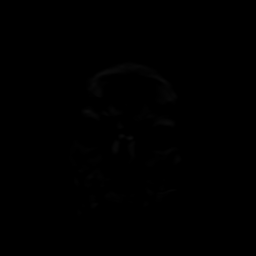
[im 16/76]
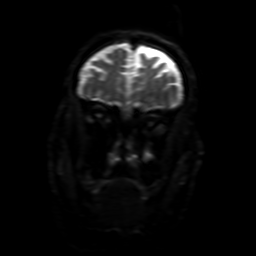
[im 31/76]
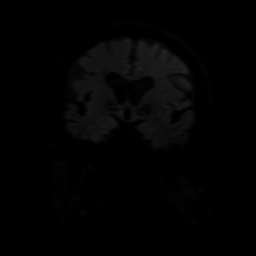
[im 46/76]
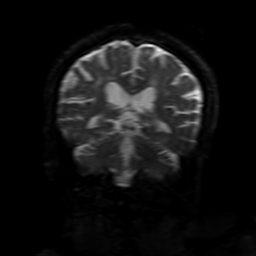
[im 61/76]
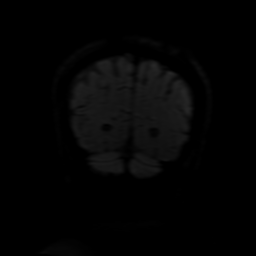
[im 76/76]
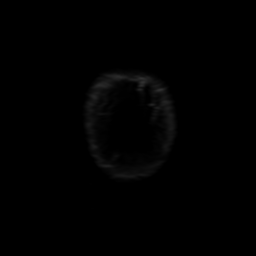

[Series 4: FLAIR · sagittal · 5.0mm · 0.23mm/px · 2 of 25 slices shown (1 of 2)]
[im 1/25]
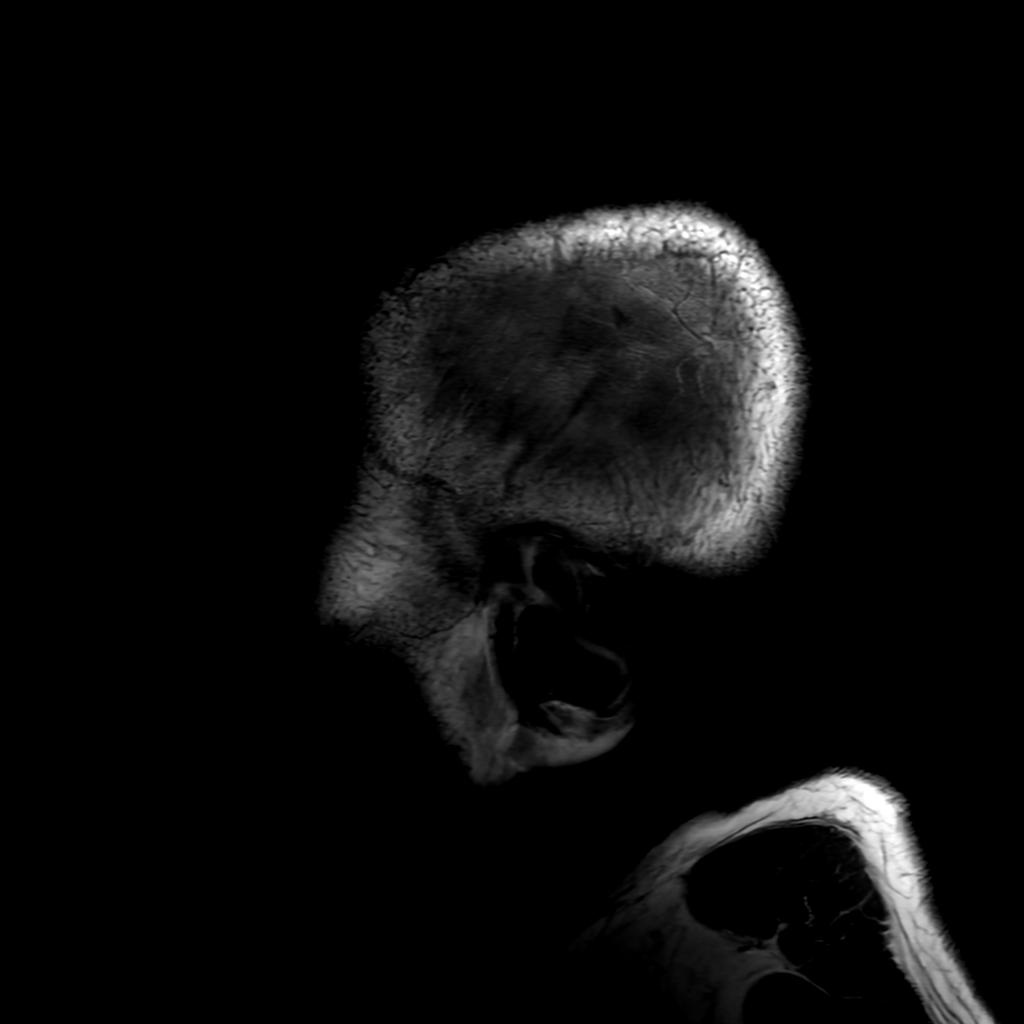
[im 25/25]
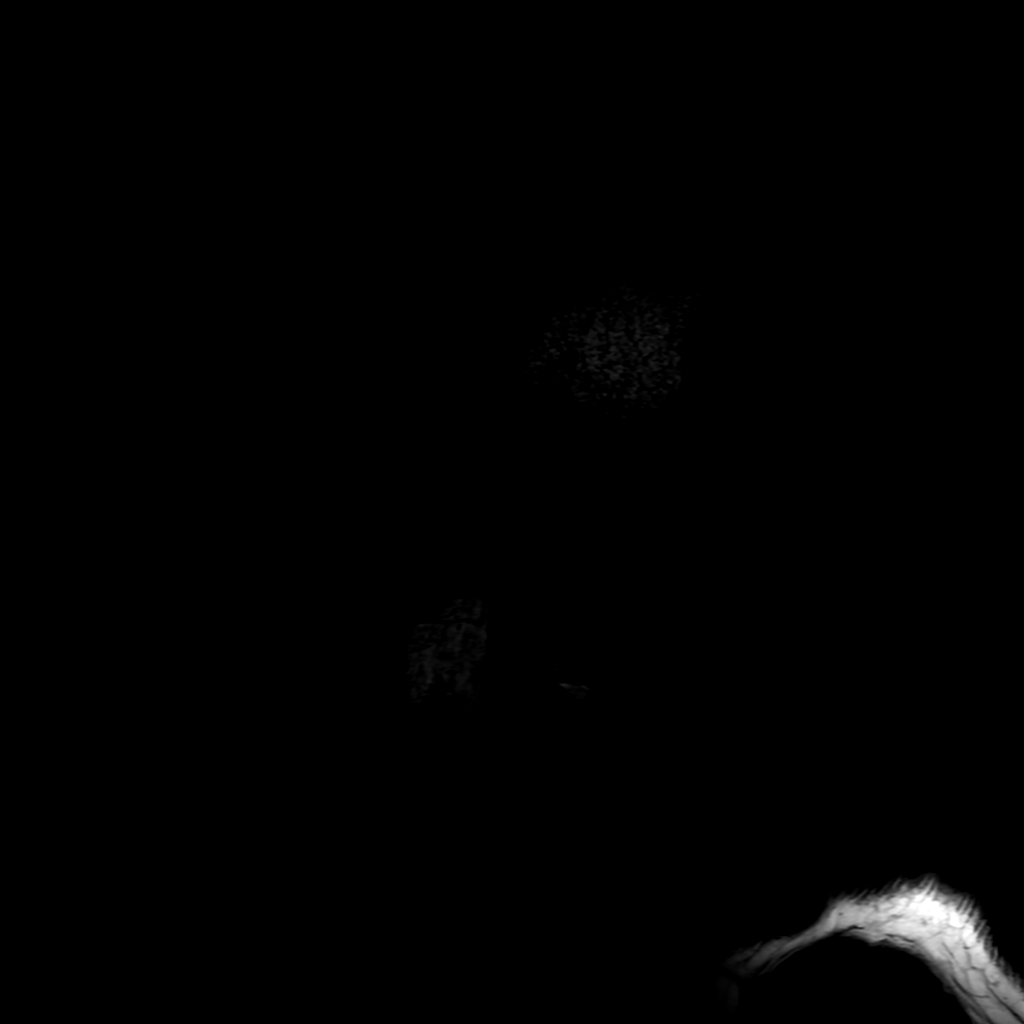

[Series 6: FLAIR · axial · 3.0mm · 0.47mm/px · z∈[-46,+103]mm · 2 of 26 slices shown (2 of 2)]
[im 1/26]
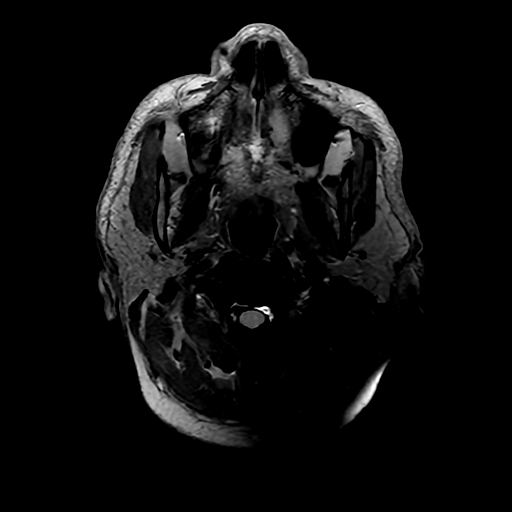
[im 26/26]
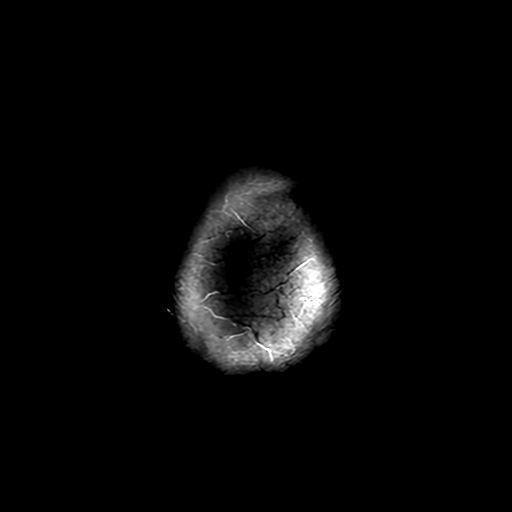

[Series 250: ADC · axial · 3.0mm · 0.94mm/px · z∈[-51,+96]mm · 4 of 50 slices shown (1 of 2)]
[im 1/50]
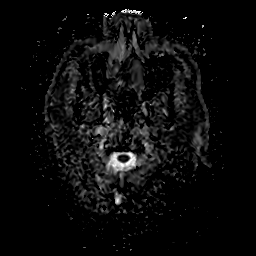
[im 17/50]
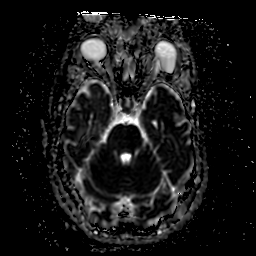
[im 33/50]
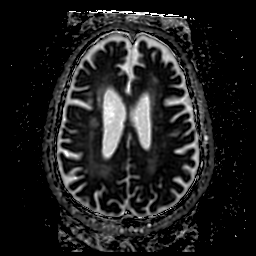
[im 50/50]
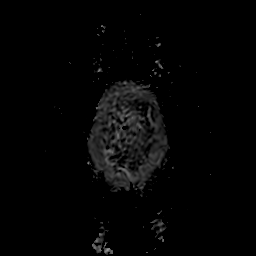

[Series 350: ADC · coronal · 4.0mm · 0.94mm/px · 3 of 38 slices shown (2 of 2)]
[im 1/38]
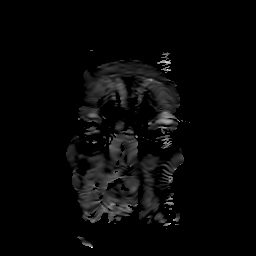
[im 19/38]
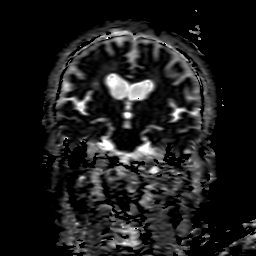
[im 38/38]
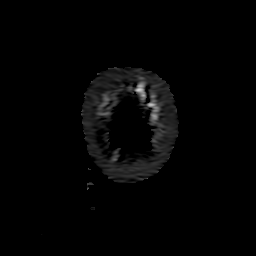

[24 of 48 positions shown; findings below may reference images not displayed]

FINDINGS: Brain: Generalized age-related cerebral atrophy. Patchy and
confluent T2/FLAIR hyperintensity within the periventricular and
deep white matter both cerebral hemispheres most consistent with
chronic small vessel ischemic disease, moderate nature. Multiple
scattered superimposed remote lacunar infarcts noted within the
hemispheric cerebral white matter. Few scattered remote bilateral
cerebellar infarcts noted as well.

Patchy small volume acute ischemic infarcts seen involving the left
10). Largest area of infarction seen on the left and measures 9 mm.
No associated hemorrhage or mass effect. No other diffusion
abnormality to suggest acute or subacute ischemia. Gray-white matter
differentiation otherwise maintained. No other areas of remote
cortical infarction. No other foci of susceptibility artifact to
suggest acute or chronic intracranial hemorrhage.

No mass lesion, midline shift or mass effect. No hydrocephalus or
extra-axial fluid collection. Pituitary gland suprasellar region
within normal limits. Midline structures intact.

Vascular: Poorly visualized flow void within the right vertebral
artery, seen to be occluded on prior CTA. Left V4 segment not well
seen on either, also seen to demonstrate a severe stenosis on prior
CTA. Major intracranial vascular flow voids are otherwise
maintained.

Skull and upper cervical spine: Craniocervical junction within
normal limits. Heterogeneous signal intensity seen throughout the
visualized bone marrow without focal marrow replacing lesion. No
scalp soft tissue abnormality.

Sinuses/Orbits: Globes and orbital soft tissues within normal
limits. Left maxillary sinus retention cyst. Scattered mucosal
thickening within the ethmoidal air cells. Paranasal sinuses are
otherwise clear. No significant mastoid effusion.

Other: None.
IMPRESSION: 1. Patchy small volume acute ischemic infarcts involving the left
greater than right cerebellar hemispheres. No associated hemorrhage
or mass effect.
2. Age-related cerebral atrophy with moderate chronic microvascular
ischemic disease, with multiple remote lacunar infarcts involving
the hemispheric cerebral white matter and cerebellum.
